# Patient Record
Sex: Female | Born: 1970 | Race: White | Hispanic: No | Smoking: Never smoker
Health system: Southern US, Community
[De-identification: ages and names within clinical notes are randomized; demographics above are authoritative.]

## PROBLEM LIST (undated history)

## (undated) DIAGNOSIS — F32A Depression, unspecified: Secondary | ICD-10-CM

## (undated) DIAGNOSIS — J45909 Unspecified asthma, uncomplicated: Secondary | ICD-10-CM

## (undated) DIAGNOSIS — Q613 Polycystic kidney, unspecified: Secondary | ICD-10-CM

## (undated) DIAGNOSIS — I10 Essential (primary) hypertension: Secondary | ICD-10-CM

## (undated) DIAGNOSIS — H5500 Unspecified nystagmus: Secondary | ICD-10-CM

## (undated) DIAGNOSIS — N83209 Unspecified ovarian cyst, unspecified side: Secondary | ICD-10-CM

## (undated) HISTORY — PX: CHOLECYSTECTOMY: SHX55

---

## 2019-10-29 ENCOUNTER — Emergency Department (HOSPITAL_COMMUNITY)
Admission: EM | Admit: 2019-10-29 | Discharge: 2019-10-29 | Disposition: A | Discharge status: Home / Self Care | Payer: Self-pay | Attending: Emergency Medicine | Admitting: Emergency Medicine

## 2019-10-29 ENCOUNTER — Other Ambulatory Visit: Payer: Self-pay

## 2019-10-29 ENCOUNTER — Encounter (HOSPITAL_COMMUNITY): Payer: Self-pay

## 2019-10-29 DIAGNOSIS — R110 Nausea: Secondary | ICD-10-CM

## 2019-10-29 DIAGNOSIS — R112 Nausea with vomiting, unspecified: Secondary | ICD-10-CM | POA: Insufficient documentation

## 2019-10-29 DIAGNOSIS — J45909 Unspecified asthma, uncomplicated: Secondary | ICD-10-CM | POA: Insufficient documentation

## 2019-10-29 DIAGNOSIS — J029 Acute pharyngitis, unspecified: Secondary | ICD-10-CM | POA: Insufficient documentation

## 2019-10-29 DIAGNOSIS — I10 Essential (primary) hypertension: Secondary | ICD-10-CM | POA: Insufficient documentation

## 2019-10-29 DIAGNOSIS — Z20828 Contact with and (suspected) exposure to other viral communicable diseases: Secondary | ICD-10-CM | POA: Insufficient documentation

## 2019-10-29 HISTORY — DX: Unspecified asthma, uncomplicated: J45.909

## 2019-10-29 HISTORY — DX: Essential (primary) hypertension: I10

## 2019-10-29 LAB — GROUP A STREP BY PCR: Group A Strep by PCR: NOT DETECTED

## 2019-10-29 MED ORDER — ONDANSETRON HCL 4 MG PO TABS: 4.0000 mg | ORAL_TABLET | Freq: Four times a day (QID) | ORAL | 0 refills | Status: DC

## 2019-10-29 MED ORDER — ONDANSETRON 4 MG PO TBDP
4.0000 mg | ORAL_TABLET | Freq: Once | ORAL | Status: AC
  Administered 2019-10-29: 20:00:00 4 mg via ORAL
  Filled 2019-10-29: qty 1

## 2019-10-29 NOTE — ED Triage Notes (Signed)
Pt arrives to ED with c/o sore throat and n/v x 1 day.

## 2019-10-29 NOTE — ED Provider Notes (Signed)
[facility] EMERGENCY DEPARTMENT Provider Note   CSN: [id number] Arrival date & time: 10/29/19  1804     History   Chief Complaint Chief Complaint  Patient presents with  . Sore Throat    HPI Tracey Cobb is a 48 y.o. female.     The history is provided by the patient. No language interpreter was used.  Sore Throat     48 year old female with history of asthma, hypertension, presenting complaining of sore throat.  Patient report throughout the day today she felt nauseous, vomited 3 times of nonbloody nonbilious content.  States from vomiting she endorsed some throat irritation.  She endorsed some mild epigastric tenderness before vomiting which improves after vomiting.  She does not complain of any fever chills, no runny nose sneezing or coughing no chest pain shortness of breath and no dysuria.  She is here per recommendation of her workplace.  She denies any recent sick contact with anyone with COVID-19.  She did endorse her mom has a slight cold several days prior.  She did recall eating a chicken salad sandwich yesterday from her workplace from a vending machine.  She is unsure if that may have caused her symptoms.  Past Medical History:  Diagnosis Date  . Asthma   . Hypertension     There are no active problems to display for this patient.   History reviewed. No pertinent surgical history.   OB History   No obstetric history on file.      Home Medications    Prior to Admission medications   Not on File    Family History History reviewed. No pertinent family history.  Social History Social History   Tobacco Use  . Smoking status: Never Smoker  . Smokeless tobacco: Never Used  Substance Use Topics  . Alcohol use: Not on file  . Drug use: Not on file     Allergies   Patient has no allergy information on record.   Review of Systems Review of Systems  All other systems reviewed and are negative.    Physical Exam Updated  Vital Signs BP (!) 157/103 (BP Location: Left Arm)   Pulse 72   Temp 97.7 F (36.5 C) (Oral)   Resp 16   SpO2 100%   Physical Exam Vitals signs and nursing note reviewed.  Constitutional:      General: She is not in acute distress.    Appearance: She is well-developed. She is obese.  HENT:     Head: Atraumatic.     Mouth/Throat:     Mouth: Mucous membranes are moist.     Pharynx: Uvula midline. No pharyngeal swelling, oropharyngeal exudate, posterior oropharyngeal erythema or uvula swelling.     Tonsils: No tonsillar exudate or tonsillar abscesses.  Eyes:     Conjunctiva/sclera: Conjunctivae normal.  Neck:     Musculoskeletal: Neck supple.  Cardiovascular:     Rate and Rhythm: Normal rate and regular rhythm.  Pulmonary:     Effort: Pulmonary effort is normal.     Breath sounds: Normal breath sounds. No wheezing, rhonchi or rales.  Abdominal:     Palpations: Abdomen is soft.     Tenderness: There is no abdominal tenderness.     Comments: Negative Murphy sign, no pain at McBurney's point.  Skin:    Findings: No rash.  Neurological:     Mental Status: She is alert and oriented to person, place, and time.  Psychiatric:  Mood and Affect: Mood normal.      ED Treatments / Results  Labs (all labs ordered are listed, but only abnormal results are displayed) Labs Reviewed  GROUP A STREP BY PCR  SARS CORONAVIRUS 2 (TAT 6-24 HRS)    EKG None  Radiology No results found.  Procedures Procedures (including critical care time)  Medications Ordered in ED Medications - No data to display   Initial Impression / Assessment and Plan / ED Course  I have reviewed the triage vital signs and the nursing notes.  Pertinent labs & imaging results that were available during my care of the patient were reviewed by me and considered in my medical decision making (see chart for details).        BP (!) 157/103 (BP Location: Left Arm)   Pulse 72   Temp 97.7 F (36.5 C)  (Oral)   Resp 16   SpO2 100%    Final Clinical Impressions(s) / ED Diagnoses   Final diagnoses:  Sore throat  Nausea    ED Discharge Orders         Ordered    ondansetron (ZOFRAN) 4 MG tablet  Every 6 hours     10/29/19 2109         7:33 PM Patient endorse sore throat from 3 episodes of vomiting throughout the day today.  Throat exam unremarkable.  Strep test ordered.  Covid test ordered as well.  Abdomen is soft nontender, low suspicion for gallbladder etiology at this time.  Zofran given for nausea.  9:04 PM Strep test negative.  COVID-19 is currently pending.  Pt d/c home with zofran for nausea.  Encourage return if worsening pain.  Patient also made me aware that she has had prior cholecystectomy.  Tracey Cobb was evaluated in Emergency Department on 10/29/2019 for the symptoms described in the history of present illness. She was evaluated in the context of the global COVID-19 pandemic, which necessitated consideration that the patient might be at risk for infection with the SARS-CoV-2 virus that causes COVID-19. Institutional protocols and algorithms that pertain to the evaluation of patients at risk for COVID-19 are in a state of rapid change based on information released by regulatory bodies including the CDC and federal and state organizations. These policies and algorithms were followed during the patient's care in the ED.    Fayrene Helper, PA-C 10/29/19 2111    Charlynne Pander, MD 10/29/19 [phone]

## 2019-10-29 NOTE — Discharge Instructions (Signed)
Take zofran as needed for nausea.  A covid-19 test have been sent, if positive you will be notify in 2-3 days.  Return if you have any concerns. Eat bland food until your symptoms resolve

## 2019-10-29 NOTE — ED Notes (Signed)
Patient verbalizes understanding of discharge instructions. Opportunity for questioning and answers were provided. Armband removed by staff, pt discharged from ED ambulatory.   

## 2019-10-30 LAB — SARS CORONAVIRUS 2 (TAT 6-24 HRS): SARS Coronavirus 2: NEGATIVE

## 2020-08-30 ENCOUNTER — Emergency Department (HOSPITAL_COMMUNITY)
Admission: EM | Admit: 2020-08-30 | Discharge: 2020-08-30 | Disposition: A | Discharge status: Home / Self Care | Payer: Self-pay | Attending: Emergency Medicine | Admitting: Emergency Medicine

## 2020-08-30 ENCOUNTER — Encounter (HOSPITAL_COMMUNITY): Payer: Self-pay | Admitting: Emergency Medicine

## 2020-08-30 ENCOUNTER — Other Ambulatory Visit: Payer: Self-pay

## 2020-08-30 DIAGNOSIS — R102 Pelvic and perineal pain: Secondary | ICD-10-CM | POA: Insufficient documentation

## 2020-08-30 DIAGNOSIS — R110 Nausea: Secondary | ICD-10-CM | POA: Insufficient documentation

## 2020-08-30 DIAGNOSIS — Z5321 Procedure and treatment not carried out due to patient leaving prior to being seen by health care provider: Secondary | ICD-10-CM | POA: Insufficient documentation

## 2020-08-30 HISTORY — DX: Polycystic kidney, unspecified: Q61.3

## 2020-08-30 HISTORY — DX: Unspecified ovarian cyst, unspecified side: N83.209

## 2020-08-30 LAB — URINALYSIS, ROUTINE W REFLEX MICROSCOPIC

## 2020-08-30 LAB — URINALYSIS, MICROSCOPIC (REFLEX): RBC / HPF: >50 RBC/hpf (ref 0–5)

## 2020-08-30 NOTE — ED Notes (Signed)
Pt did not answer for vs x3

## 2020-08-30 NOTE — ED Triage Notes (Signed)
C/o L sided pelvic pain since yesterday with nausea.  Hx of ovarian cyst.  States it feels like when her cyst ruptured.

## 2020-08-31 ENCOUNTER — Emergency Department (HOSPITAL_BASED_OUTPATIENT_CLINIC_OR_DEPARTMENT_OTHER): Payer: Self-pay

## 2020-08-31 ENCOUNTER — Encounter (HOSPITAL_BASED_OUTPATIENT_CLINIC_OR_DEPARTMENT_OTHER): Payer: Self-pay

## 2020-08-31 ENCOUNTER — Other Ambulatory Visit: Payer: Self-pay

## 2020-08-31 ENCOUNTER — Emergency Department (HOSPITAL_BASED_OUTPATIENT_CLINIC_OR_DEPARTMENT_OTHER)
Admission: EM | Admit: 2020-08-31 | Discharge: 2020-08-31 | Disposition: A | Discharge status: Home / Self Care | Payer: Self-pay | Attending: Emergency Medicine | Admitting: Emergency Medicine

## 2020-08-31 DIAGNOSIS — I10 Essential (primary) hypertension: Secondary | ICD-10-CM | POA: Insufficient documentation

## 2020-08-31 DIAGNOSIS — Z79899 Other long term (current) drug therapy: Secondary | ICD-10-CM | POA: Insufficient documentation

## 2020-08-31 DIAGNOSIS — J45909 Unspecified asthma, uncomplicated: Secondary | ICD-10-CM | POA: Insufficient documentation

## 2020-08-31 DIAGNOSIS — R109 Unspecified abdominal pain: Secondary | ICD-10-CM | POA: Insufficient documentation

## 2020-08-31 LAB — URINALYSIS, ROUTINE W REFLEX MICROSCOPIC
Bilirubin Urine: NEGATIVE
Glucose, UA: NEGATIVE mg/dL
Ketones, ur: NEGATIVE mg/dL
Nitrite: NEGATIVE
Protein, ur: NEGATIVE mg/dL
Specific Gravity, Urine: 1.015 (ref 1.005–1.030)
pH: 7.5 (ref 5.0–8.0)

## 2020-08-31 LAB — COMPREHENSIVE METABOLIC PANEL
ALT: 22 U/L (ref 0–44)
AST: 16 U/L (ref 15–41)
Albumin: 3.8 g/dL (ref 3.5–5.0)
Alkaline Phosphatase: 85 U/L (ref 38–126)
Anion gap: 8 (ref 5–15)
BUN: 7 mg/dL (ref 6–20)
CO2: 25 mmol/L (ref 22–32)
Calcium: 8.9 mg/dL (ref 8.9–10.3)
Chloride: 102 mmol/L (ref 98–111)
Creatinine, Ser: 0.67 mg/dL (ref 0.44–1.00)
GFR calc Af Amer: >60 mL/min (ref >60)
GFR calc non Af Amer: >60 mL/min (ref >60)
Glucose, Bld: 104 mg/dL — ABNORMAL HIGH (ref 70–99)
Potassium: 3.8 mmol/L (ref 3.5–5.1)
Sodium: 135 mmol/L (ref 135–145)
Total Bilirubin: 0.2 mg/dL — ABNORMAL LOW (ref 0.3–1.2)
Total Protein: 7.3 g/dL (ref 6.5–8.1)

## 2020-08-31 LAB — CBC WITH DIFFERENTIAL/PLATELET
Abs Immature Granulocytes: 0.01 10*3/uL (ref 0.00–0.07)
Basophils Absolute: 0.1 10*3/uL (ref 0.0–0.1)
Basophils Relative: 1 %
Eosinophils Absolute: 0.2 10*3/uL (ref 0.0–0.5)
Eosinophils Relative: 3 %
HCT: 34.6 % — ABNORMAL LOW (ref 36.0–46.0)
Hemoglobin: 11.2 g/dL — ABNORMAL LOW (ref 12.0–15.0)
Immature Granulocytes: 0 %
Lymphocytes Relative: 33 %
Lymphs Abs: 2 10*3/uL (ref 0.7–4.0)
MCH: 28.4 pg (ref 26.0–34.0)
MCHC: 32.4 g/dL (ref 30.0–36.0)
MCV: 87.6 fL (ref 80.0–100.0)
Monocytes Absolute: 0.6 10*3/uL (ref 0.1–1.0)
Monocytes Relative: 10 %
Neutro Abs: 3.3 10*3/uL (ref 1.7–7.7)
Neutrophils Relative %: 53 %
Platelets: 317 10*3/uL (ref 150–400)
RBC: 3.95 MIL/uL (ref 3.87–5.11)
RDW: 13.6 % (ref 11.5–15.5)
WBC: 6.1 10*3/uL (ref 4.0–10.5)
nRBC: 0 % (ref 0.0–0.2)

## 2020-08-31 LAB — URINALYSIS, MICROSCOPIC (REFLEX)

## 2020-08-31 LAB — PREGNANCY, URINE: Preg Test, Ur: NEGATIVE

## 2020-08-31 MED ORDER — ONDANSETRON HCL 4 MG/2ML IJ SOLN
4.0000 mg | Freq: Once | INTRAMUSCULAR | Status: AC
  Administered 2020-08-31: 4 mg via INTRAVENOUS
  Filled 2020-08-31: qty 2

## 2020-08-31 MED ORDER — MORPHINE SULFATE (PF) 4 MG/ML IV SOLN
4.0000 mg | Freq: Once | INTRAVENOUS | Status: AC
  Administered 2020-08-31: 4 mg via INTRAVENOUS
  Filled 2020-08-31: qty 1

## 2020-08-31 MED ORDER — ONDANSETRON 4 MG PO TBDP: 4.0000 mg | ORAL_TABLET | ORAL | 0 refills | Status: DC | PRN

## 2020-08-31 MED ORDER — HYDROCODONE-ACETAMINOPHEN 5-325 MG PO TABS: 1.0000 | ORAL_TABLET | Freq: Four times a day (QID) | ORAL | 0 refills | Status: DC | PRN

## 2020-08-31 MED ORDER — IBUPROFEN 600 MG PO TABS: 600.0000 mg | ORAL_TABLET | Freq: Three times a day (TID) | ORAL | 0 refills | Status: DC | PRN

## 2020-08-31 MED ORDER — CEPHALEXIN 500 MG PO CAPS: 1000.0000 mg | ORAL_CAPSULE | Freq: Two times a day (BID) | ORAL | 0 refills | Status: DC

## 2020-08-31 MED ORDER — KETOROLAC TROMETHAMINE 30 MG/ML IJ SOLN
30.0000 mg | Freq: Once | INTRAMUSCULAR | Status: AC
  Administered 2020-08-31: 30 mg via INTRAVENOUS
  Filled 2020-08-31: qty 1

## 2020-08-31 NOTE — ED Notes (Signed)
ED Provider at bedside. 

## 2020-08-31 NOTE — ED Provider Notes (Signed)
[facility] EMERGENCY DEPARTMENT Provider Note   CSN: [id number] Arrival date & time: 08/31/20  1141     History Chief Complaint  Patient presents with  . Flank Pain    Tracey Cobb is a 49 y.o. female.  Patient is a 49 year old female with a history of ovarian cyst, obesity, hypertension, polycystic kidney disease and prior kidney stones status post lithotripsy in 2016 who presents today with sudden onset of left-sided abdominal pain that started at 6 PM last night.  She reports the pain is severe and causes nausea but she has not had any vomiting.  She reports this pain feels similar to when she has had prior kidney stones.  She denies any fever, dysuria and reports the pain wraps from the left flank around to the abdomen.  Laying down seems to make it worse nothing seems to make it better.  The history is provided by the patient.  Flank Pain This is a new problem. The current episode started yesterday. The problem occurs constantly. The problem has not changed since onset.Associated symptoms include abdominal pain.       Past Medical History:  Diagnosis Date  . Asthma   . Hypertension   . Ovarian cyst   . Polycystic kidney disease     There are no problems to display for this patient.   History reviewed. No pertinent surgical history.   OB History   No obstetric history on file.     No family history on file.  Social History   Tobacco Use  . Smoking status: Never Smoker  . Smokeless tobacco: Never Used  Vaping Use  . Vaping Use: Never used  Substance Use Topics  . Alcohol use: Not Currently  . Drug use: Not Currently    Home Medications Prior to Admission medications   Medication Sig Start Date End Date Taking? Authorizing Provider  ondansetron (ZOFRAN) 4 MG tablet Take 1 tablet (4 mg total) by mouth every 6 (six) hours. 10/29/19   Fayrene Helper, PA-C    Allergies    Benadryl [diphenhydramine], Ivp dye [iodinated diagnostic agents], and  Reglan [metoclopramide]  Review of Systems   Review of Systems  Gastrointestinal: Positive for abdominal pain.  Genitourinary: Positive for flank pain.  All other systems reviewed and are negative.   Physical Exam Updated Vital Signs BP 139/81 (BP Location: Left Arm)   Pulse 72   Temp 97.8 F (36.6 C) (Oral)   Resp 16   Ht 5\' 2"  (1.575 m)   Wt 132 kg   LMP 08/27/2020   SpO2 98%   BMI 53.22 kg/m   Physical Exam Vitals and nursing note reviewed.  Constitutional:      General: She is in acute distress.     Appearance: Normal appearance. She is well-developed. She is obese.  HENT:     Head: Normocephalic and atraumatic.  Eyes:     Pupils: Pupils are equal, round, and reactive to light.  Cardiovascular:     Rate and Rhythm: Normal rate and regular rhythm.     Heart sounds: Normal heart sounds. No murmur heard.  No friction rub.  Pulmonary:     Effort: Pulmonary effort is normal.     Breath sounds: Normal breath sounds. No wheezing or rales.  Abdominal:     General: Bowel sounds are normal. There is no distension.     Palpations: Abdomen is soft.     Tenderness: There is no abdominal tenderness. There is left CVA tenderness. There  is no guarding or rebound.  Musculoskeletal:        General: No tenderness. Normal range of motion.     Cervical back: Normal range of motion and neck supple.     Comments: No edema  Skin:    General: Skin is warm and dry.     Findings: No rash.  Neurological:     General: No focal deficit present.     Mental Status: She is alert and oriented to person, place, and time. Mental status is at baseline.     Cranial Nerves: No cranial nerve deficit.  Psychiatric:        Mood and Affect: Mood normal.        Behavior: Behavior normal.        Thought Content: Thought content normal.     ED Results / Procedures / Treatments   Labs (all labs ordered are listed, but only abnormal results are displayed) Labs Reviewed  URINALYSIS, ROUTINE W  REFLEX MICROSCOPIC - Abnormal; Notable for the following components:      Result Value   Hgb urine dipstick LARGE (*)    Leukocytes,Ua TRACE (*)    All other components within normal limits  URINALYSIS, MICROSCOPIC (REFLEX) - Abnormal; Notable for the following components:   Bacteria, UA FEW (*)    All other components within normal limits  PREGNANCY, URINE  CBC WITH DIFFERENTIAL/PLATELET  COMPREHENSIVE METABOLIC PANEL    EKG None  Radiology No results found.  Procedures Procedures (including critical care time)  Medications Ordered in ED Medications  morphine 4 MG/ML injection 4 mg (has no administration in time range)  ondansetron (ZOFRAN) injection 4 mg (has no administration in time range)    ED Course  I have reviewed the triage vital signs and the nursing notes.  Pertinent labs & imaging results that were available during my care of the patient were reviewed by me and considered in my medical decision making (see chart for details).    MDM Rules/Calculators/A&P                          Pt with symptoms consistent with kidney stone.  Denies infectious sx, or GI symptoms.  Low concern for diverticulitis and no risk factors or history suggestive of AAA.  No hx suggestive of GU source (discharge) and otherwise pt is healthy.  Will hydrate, treat pain and ensure no infection with UA, CBC, CMP and will get stone study to further eval.  2:36 PM UA with large hemoglobin.  Labs and imaging pending.  Final Clinical Impression(s) / ED Diagnoses Final diagnoses:  None    Rx / DC Orders ED Discharge Orders    None       Gwyneth Sprout, MD 09/01/20 0740

## 2020-08-31 NOTE — ED Notes (Signed)
Patient transported to CT 

## 2020-08-31 NOTE — ED Triage Notes (Signed)
Pt c/o "kidney pain" left flank x 2 days-NAD-slow gait

## 2020-08-31 NOTE — ED Provider Notes (Signed)
Pain control. Outpatient F/U Physical Exam  BP 139/81 (BP Location: Left Arm)    Pulse 72    Temp 97.8 F (36.6 C) (Oral)    Resp 16    Ht 5\' 2"  (1.575 m)    Wt 132 kg    LMP 08/26/2020    SpO2 98%    BMI 53.22 kg/m   Physical Exam Patient is alert and nontoxic. She does have right-sided flank pain in mid right abdominal pain to outpatient. No guarding. ED Course/Procedures     Procedures  MDM  Review of urinalysis from yesterday does show greater than 50 red cells and white cells present with a clean appearing specimen. Some patient symptoms will add Keflex and patient will follow up on culture results which have been submitted. Patient does not currently have a PCP in the area. Resource guide provided and patient counseled on using referral number. Return precautions reviewed.       08/28/2020, MD 08/31/20 1600

## 2020-08-31 NOTE — Discharge Instructions (Signed)
1. A urine culture has been done. You should be able to get results in 24 to 48 h. In the meantime, you have been placed on Keflex for symptoms of urinary tract infection. 2. Get scheduled to follow-up with a family doctor soon as possible. You may try  community health and wellness as listed in your discharge instructions, as well a guide of low cost medical facilities is included. 3. Return to the emergency department if you get fevers, worsening pain, vomiting or other concerning symptoms.

## 2020-08-31 NOTE — ED Notes (Signed)
Attempted IV x 2 , tol well, unsuccessful

## 2020-09-01 LAB — URINE CULTURE: Culture: 50000 — AB

## 2020-09-03 ENCOUNTER — Telehealth (HOSPITAL_BASED_OUTPATIENT_CLINIC_OR_DEPARTMENT_OTHER): Payer: Self-pay | Admitting: Emergency Medicine

## 2020-09-03 NOTE — Telephone Encounter (Signed)
Post ED Visit - Positive Culture Follow-up  Culture report reviewed by antimicrobial stewardship pharmacist: [facility] []  [facility], Pharm.D. []  [facility], Pharm.D., BCPS AQ-ID []  , Pharm.D., BCPS []  Celedonio Miyamoto, Pharm.D., BCPS []  [facility], Garvin Fila.D., BCPS, AAHIVP []  , Pharm.D., BCPS, AAHIVP []  Georgina Pillion, PharmD, BCPS []  , PharmD, BCPS []  [place], PharmD, BCPS [x]  [address], PharmD []  , PharmD, BCPS []  Estella Husk, PharmD  Pharmacy Team []  Lysle Pearl, PharmD []  , PharmD []  Phillips Climes, PharmD []  , Rph []  Agap[employer] Games) , PharmD []  Joaquim Lai, PharmD []  , PharmD []  Mervyn Gay, PharmD []  , PharmD []  Vinnie Level, PharmD []  [facility], PharmD []  , PharmD []  Len Childs, PharmD   Positive urine culture Treated with Cephalexin, organism sensitive to the same and no further patient follow-up is required at this time.  09/03/2020, 9:37 AM

## 2020-10-26 ENCOUNTER — Other Ambulatory Visit: Payer: Self-pay

## 2020-10-26 ENCOUNTER — Emergency Department (HOSPITAL_COMMUNITY)
Admission: EM | Admit: 2020-10-26 | Discharge: 2020-10-26 | Disposition: A | Discharge status: Home / Self Care | Payer: Self-pay | Attending: Emergency Medicine | Admitting: Emergency Medicine

## 2020-10-26 ENCOUNTER — Emergency Department (HOSPITAL_BASED_OUTPATIENT_CLINIC_OR_DEPARTMENT_OTHER)
Admission: EM | Admit: 2020-10-26 | Discharge: 2020-10-26 | Disposition: A | Discharge status: Home / Self Care | Payer: Self-pay | Attending: Emergency Medicine | Admitting: Emergency Medicine

## 2020-10-26 ENCOUNTER — Emergency Department (HOSPITAL_COMMUNITY): Payer: Self-pay

## 2020-10-26 ENCOUNTER — Encounter (HOSPITAL_COMMUNITY): Payer: Self-pay | Admitting: Emergency Medicine

## 2020-10-26 ENCOUNTER — Encounter (HOSPITAL_BASED_OUTPATIENT_CLINIC_OR_DEPARTMENT_OTHER): Payer: Self-pay | Admitting: *Deleted

## 2020-10-26 DIAGNOSIS — J40 Bronchitis, not specified as acute or chronic: Secondary | ICD-10-CM | POA: Insufficient documentation

## 2020-10-26 DIAGNOSIS — I10 Essential (primary) hypertension: Secondary | ICD-10-CM | POA: Insufficient documentation

## 2020-10-26 DIAGNOSIS — R079 Chest pain, unspecified: Secondary | ICD-10-CM | POA: Insufficient documentation

## 2020-10-26 DIAGNOSIS — R040 Epistaxis: Secondary | ICD-10-CM | POA: Insufficient documentation

## 2020-10-26 DIAGNOSIS — R42 Dizziness and giddiness: Secondary | ICD-10-CM | POA: Insufficient documentation

## 2020-10-26 DIAGNOSIS — R059 Cough, unspecified: Secondary | ICD-10-CM | POA: Insufficient documentation

## 2020-10-26 DIAGNOSIS — Z20822 Contact with and (suspected) exposure to covid-19: Secondary | ICD-10-CM | POA: Insufficient documentation

## 2020-10-26 DIAGNOSIS — Z5321 Procedure and treatment not carried out due to patient leaving prior to being seen by health care provider: Secondary | ICD-10-CM | POA: Insufficient documentation

## 2020-10-26 LAB — BASIC METABOLIC PANEL
Anion gap: 8 (ref 5–15)
BUN: 11 mg/dL (ref 6–20)
CO2: 25 mmol/L (ref 22–32)
Calcium: 8.8 mg/dL — ABNORMAL LOW (ref 8.9–10.3)
Chloride: 106 mmol/L (ref 98–111)
Creatinine, Ser: 0.88 mg/dL (ref 0.44–1.00)
GFR, Estimated: >60 mL/min (ref >60)
Glucose, Bld: 151 mg/dL — ABNORMAL HIGH (ref 70–99)
Potassium: 3.8 mmol/L (ref 3.5–5.1)
Sodium: 139 mmol/L (ref 135–145)

## 2020-10-26 LAB — CBC
HCT: 38.4 % (ref 36.0–46.0)
Hemoglobin: 12.1 g/dL (ref 12.0–15.0)
MCH: 27.1 pg (ref 26.0–34.0)
MCHC: 31.5 g/dL (ref 30.0–36.0)
MCV: 86.1 fL (ref 80.0–100.0)
Platelets: 308 10*3/uL (ref 150–400)
RBC: 4.46 MIL/uL (ref 3.87–5.11)
RDW: 15.3 % (ref 11.5–15.5)
WBC: 7.3 10*3/uL (ref 4.0–10.5)
nRBC: 0 % (ref 0.0–0.2)

## 2020-10-26 LAB — RESP PANEL BY RT-PCR (FLU A&B, COVID) ARPGX2
Influenza A by PCR: NEGATIVE
Influenza B by PCR: NEGATIVE
SARS Coronavirus 2 by RT PCR: NEGATIVE

## 2020-10-26 LAB — I-STAT BETA HCG BLOOD, ED (MC, WL, AP ONLY): I-stat hCG, quantitative: <5 m[IU]/mL (ref <5)

## 2020-10-26 LAB — TROPONIN I (HIGH SENSITIVITY): Troponin I (High Sensitivity): <2 ng/L (ref <18)

## 2020-10-26 MED ORDER — PREDNISONE 20 MG PO TABS: ORAL_TABLET | ORAL | 0 refills | Status: DC

## 2020-10-26 MED ORDER — ALBUTEROL SULFATE HFA 108 (90 BASE) MCG/ACT IN AERS
2.0000 | INHALATION_SPRAY | Freq: Once | RESPIRATORY_TRACT | Status: AC
  Administered 2020-10-26: 2 via RESPIRATORY_TRACT
  Filled 2020-10-26: qty 6.7

## 2020-10-26 MED ORDER — PREDNISONE 50 MG PO TABS
60.0000 mg | ORAL_TABLET | Freq: Once | ORAL | Status: AC
  Administered 2020-10-26: 60 mg via ORAL
  Filled 2020-10-26: qty 1

## 2020-10-26 NOTE — ED Notes (Signed)
Pt refused covid test

## 2020-10-26 NOTE — ED Notes (Signed)
Pt states they are leaving  

## 2020-10-26 NOTE — ED Triage Notes (Signed)
C/o cough and head congestion x 2 days

## 2020-10-26 NOTE — Discharge Instructions (Signed)
You likely have some bronchitis.  Take prednisone as prescribed.  Use albuterol every 4 hours for wheezing.  You were tested for Covid and please stay home until results come back in several hours.  If you are positive you will need to stay home for 10 days for  See your doctor for follow-up  Return to ER if you have trouble breathing, worse wheezing, cough, fevers.

## 2020-10-26 NOTE — ED Provider Notes (Signed)
[facility] EMERGENCY DEPARTMENT Provider Note   CSN: [id number] Arrival date & time: 10/26/20  2145     History Chief Complaint  Patient presents with  . covid symptoms    Tracey Cobb is a 49 y.o. female history of asthma, hypertension here presenting with cough and congestion.  Patient states that for the last 2 days she started having sore throat and then had congestion and cough.  Patient has a history of asthma and was concerned she has asthma attack.  She states that she received 2 doses of her Covid vaccine already.  She states that her mother is sick with mild sore throat that resolved.  No known sick contacts with Covid.  She went to [facility] earlier today and had a normal chest x-ray and normal labs.  She left without being seen however.  The history is provided by the patient.       Past Medical History:  Diagnosis Date  . Asthma   . Hypertension   . Ovarian cyst   . Polycystic kidney disease     There are no problems to display for this patient.   History reviewed. No pertinent surgical history.   OB History   No obstetric history on file.     No family history on file.  Social History   Tobacco Use  . Smoking status: Never Smoker  . Smokeless tobacco: Never Used  Vaping Use  . Vaping Use: Never used  Substance Use Topics  . Alcohol use: Not Currently  . Drug use: Not Currently    Home Medications Prior to Admission medications   Medication Sig Start Date End Date Taking? Authorizing Provider  cephALEXin (KEFLEX) 500 MG capsule Take 2 capsules (1,000 mg total) by mouth 2 (two) times daily. 08/31/20   Arby Barrette, MD  HYDROcodone-acetaminophen (NORCO/VICODIN) 5-325 MG tablet Take 1 tablet by mouth every 6 (six) hours as needed for moderate pain or severe pain. 08/31/20   Arby Barrette, MD  ibuprofen (ADVIL) 600 MG tablet Take 1 tablet (600 mg total) by mouth every 8 (eight) hours as needed. 08/31/20   Arby Barrette, MD  ondansetron  (ZOFRAN ODT) 4 MG disintegrating tablet Take 1 tablet (4 mg total) by mouth every 4 (four) hours as needed for nausea or vomiting. 08/31/20   Arby Barrette, MD  ondansetron (ZOFRAN) 4 MG tablet Take 1 tablet (4 mg total) by mouth every 6 (six) hours. 10/29/19   Fayrene Helper, PA-C  predniSONE (DELTASONE) 20 MG tablet Take 60 mg daily x 2 days then 40 mg daily x 2 days then 20 mg daily x 2 days 10/26/20   Charlynne Pander, MD    Allergies    Benadryl [diphenhydramine], Ivp dye [iodinated diagnostic agents], and Reglan [metoclopramide]  Review of Systems   Review of Systems  Respiratory: Positive for choking.   All other systems reviewed and are negative.   Physical Exam Updated Vital Signs BP (!) 148/100 (BP Location: Left Arm)   Pulse 77   Temp 97.8 F (36.6 C) (Oral)   Resp 20   Ht 5\' 2"  (1.575 m)   Wt 129.7 kg   SpO2 96%   BMI 52.31 kg/m   Physical Exam Vitals and nursing note reviewed.  Constitutional:      Appearance: Normal appearance.  HENT:     Head: Normocephalic.     Ears:     Comments: There is fluid behind both ears but no obvious otitis media    Nose:  Nose normal.     Mouth/Throat:     Mouth: Mucous membranes are moist.  Eyes:     Extraocular Movements: Extraocular movements intact.     Pupils: Pupils are equal, round, and reactive to light.  Cardiovascular:     Rate and Rhythm: Normal rate and regular rhythm.     Pulses: Normal pulses.     Heart sounds: Normal heart sounds.  Pulmonary:     Comments: Diminished breath sounds bilaterally but no wheezing or retraction Abdominal:     General: Abdomen is flat.     Palpations: Abdomen is soft.  Musculoskeletal:        General: Normal range of motion.     Cervical back: Normal range of motion and neck supple.  Skin:    General: Skin is warm.     Capillary Refill: Capillary refill takes less than 2 seconds.  Neurological:     General: No focal deficit present.     Mental Status: She is alert and  oriented to person, place, and time.  Psychiatric:        Mood and Affect: Mood normal.        Behavior: Behavior normal.     ED Results / Procedures / Treatments   Labs (all labs ordered are listed, but only abnormal results are displayed) Labs Reviewed  RESP PANEL BY RT-PCR (FLU A&B, COVID) [id number]    EKG None  Radiology DG Chest 2 View  Result Date: 10/26/2020 CLINICAL DATA:  Chest pain and cough EXAM: CHEST - 2 VIEW COMPARISON:  March 06, 2020 FINDINGS: Lungs are clear. Heart size and pulmonary vascularity are normal. No adenopathy. No pneumothorax. No bone lesions. IMPRESSION: Lungs clear.  Cardiac silhouette normal Electronically Signed   By: Bretta Bang III M.D.   On: 10/26/2020 18:48    Procedures Procedures (including critical care time)  Medications Ordered in ED Medications  predniSONE (DELTASONE) tablet 60 mg (has no administration in time range)  albuterol (VENTOLIN HFA) 108 (90 Base) MCG/ACT inhaler 2 puff (has no administration in time range)    ED Course  I have reviewed the triage vital signs and the nursing notes.  Pertinent labs & imaging results that were available during my care of the patient were reviewed by me and considered in my medical decision making (see chart for details).    MDM Rules/Calculators/A&P                         Tracey Cobb is a 49 y.o. female presenting with cough and congestion.  Likely bronchitis versus Covid.  She had chest x-ray and labs done earlier today that was unremarkable.  Patient is now agreeable to Covid test.  She is not hypoxic so will not need admission for Covid anyway.  Will discharge home with course of prednisone and albuterol as needed.  Tracey Cobb was evaluated in Emergency Department on 10/26/2020 for the symptoms described in the history of present illness. She was evaluated in the context of the global COVID-19 pandemic, which necessitated consideration that the patient might be at risk for  infection with the SARS-CoV-2 virus that causes COVID-19. Institutional protocols and algorithms that pertain to the evaluation of patients at risk for COVID-19 are in a state of rapid change based on information released by regulatory bodies including the CDC and federal and state organizations. These policies and algorithms were followed during the patient's care in the ED.    Final Clinical  Impression(s) / ED Diagnoses Final diagnoses:  Bronchitis    Rx / DC Orders ED Discharge Orders         Ordered    predniSONE (DELTASONE) 20 MG tablet        10/26/20 2205           Charlynne Pander, MD 10/26/20 2210

## 2020-10-26 NOTE — ED Triage Notes (Signed)
Pt endorses cough for 2 days and that she coughed so hard that her nose bled. States her chest hurts from coughing so much, lightheadedness and dizzy.

## 2020-11-05 ENCOUNTER — Encounter (HOSPITAL_BASED_OUTPATIENT_CLINIC_OR_DEPARTMENT_OTHER): Payer: Self-pay | Admitting: Emergency Medicine

## 2020-11-05 ENCOUNTER — Other Ambulatory Visit: Payer: Self-pay

## 2020-11-05 ENCOUNTER — Emergency Department (HOSPITAL_BASED_OUTPATIENT_CLINIC_OR_DEPARTMENT_OTHER): Payer: Self-pay

## 2020-11-05 ENCOUNTER — Emergency Department (HOSPITAL_BASED_OUTPATIENT_CLINIC_OR_DEPARTMENT_OTHER)
Admission: EM | Admit: 2020-11-05 | Discharge: 2020-11-05 | Disposition: A | Discharge status: Home / Self Care | Payer: Self-pay | Attending: Emergency Medicine | Admitting: Emergency Medicine

## 2020-11-05 DIAGNOSIS — J069 Acute upper respiratory infection, unspecified: Secondary | ICD-10-CM | POA: Insufficient documentation

## 2020-11-05 DIAGNOSIS — I10 Essential (primary) hypertension: Secondary | ICD-10-CM | POA: Insufficient documentation

## 2020-11-05 DIAGNOSIS — H9203 Otalgia, bilateral: Secondary | ICD-10-CM | POA: Insufficient documentation

## 2020-11-05 DIAGNOSIS — J45909 Unspecified asthma, uncomplicated: Secondary | ICD-10-CM | POA: Insufficient documentation

## 2020-11-05 MED ORDER — PSEUDOEPHEDRINE HCL 60 MG PO TABS: 60.0000 mg | ORAL_TABLET | Freq: Three times a day (TID) | ORAL | 0 refills | Status: AC | PRN

## 2020-11-05 MED ORDER — PREDNISONE 20 MG PO TABS: 40.0000 mg | ORAL_TABLET | Freq: Every day | ORAL | 0 refills | Status: AC

## 2020-11-05 NOTE — ED Provider Notes (Signed)
[facility] EMERGENCY DEPARTMENT Provider Note   CSN: [id number] Arrival date & time: 11/05/20  1608     History Chief Complaint  Patient presents with  . URI    Tracey Cobb is a 49 y.o. female past medical history of asthma and, hypertension who presents for evaluation of persistent cough, nasal congestion, body aches, bilateral ear pain that have been ongoing since 10/24/2020.  She states cough is productive of phlegm.  She states that she feels like when she coughs, makes her ears hurt more.  She has not noted any fever.  She has some associated chest soreness that occurs with coughing.  She states that she feels tired, achy and fatigued.  She was seen on 10/26/2020 for evaluation of symptoms.  She was diagnosed with bronchitis and sent home with prednisone, albuterol.  She states she has used those as well as over-the-counter medication with no improvement in symptoms.  Patient states she has not had any vomiting, difficulty breathing.  She has been vaccinated for Covid.  She does report that she was around her niece at the end of October who had RSV.  She states mom has been at home with similar symptoms.  The history is provided by the patient.       Past Medical History:  Diagnosis Date  . Asthma   . Hypertension   . Ovarian cyst   . Polycystic kidney disease     There are no problems to display for this patient.   History reviewed. No pertinent surgical history.   OB History   No obstetric history on file.     No family history on file.  Social History   Tobacco Use  . Smoking status: Never Smoker  . Smokeless tobacco: Never Used  Vaping Use  . Vaping Use: Never used  Substance Use Topics  . Alcohol use: Not Currently  . Drug use: Not Currently    Home Medications Prior to Admission medications   Medication Sig Start Date End Date Taking? Authorizing Provider  cephALEXin (KEFLEX) 500 MG capsule Take 2 capsules (1,000 mg total) by mouth 2  (two) times daily. 08/31/20   Arby Barrette, MD  HYDROcodone-acetaminophen (NORCO/VICODIN) 5-325 MG tablet Take 1 tablet by mouth every 6 (six) hours as needed for moderate pain or severe pain. 08/31/20   Arby Barrette, MD  ibuprofen (ADVIL) 600 MG tablet Take 1 tablet (600 mg total) by mouth every 8 (eight) hours as needed. 08/31/20   Arby Barrette, MD  ondansetron (ZOFRAN ODT) 4 MG disintegrating tablet Take 1 tablet (4 mg total) by mouth every 4 (four) hours as needed for nausea or vomiting. 08/31/20   Arby Barrette, MD  ondansetron (ZOFRAN) 4 MG tablet Take 1 tablet (4 mg total) by mouth every 6 (six) hours. 10/29/19   Fayrene Helper, PA-C  predniSONE (DELTASONE) 20 MG tablet Take 2 tablets (40 mg total) by mouth daily for 4 days. 11/05/20 11/09/20  Maxwell Caul, PA-C  pseudoephedrine (SUDAFED) 60 MG tablet Take 1 tablet (60 mg total) by mouth every 8 (eight) hours as needed for up to 4 days for congestion. 11/05/20 11/09/20  Maxwell Caul, PA-C    Allergies    Benadryl [diphenhydramine], Ivp dye [iodinated diagnostic agents], and Reglan [metoclopramide]  Review of Systems   Review of Systems  Constitutional: Negative for fever.  HENT: Positive for congestion and rhinorrhea.   Respiratory: Positive for cough. Negative for shortness of breath.   Cardiovascular: Negative for chest pain.  Gastrointestinal: Negative for abdominal pain, nausea and vomiting.  Genitourinary: Negative for dysuria and hematuria.  Musculoskeletal: Positive for myalgias.  Neurological: Negative for headaches.  All other systems reviewed and are negative.   Physical Exam Updated Vital Signs BP 139/80 (BP Location: Left Arm)   Pulse 83   Temp 98 F (36.7 C) (Oral)   Resp 20   Ht 5\' 2"  (1.575 m)   Wt 129.7 kg   SpO2 98%   BMI 52.31 kg/m   Physical Exam Vitals and nursing note reviewed.  Constitutional:      Appearance: She is well-developed.  HENT:     Head: Normocephalic and atraumatic.      Left Ear: Tympanic membrane is erythematous.     Ears:     Comments: Unable to assess right TM secondary to cerumen impaction.  Left TM is erythematous but no bulging, effusion. Eyes:     General: No scleral icterus.       Right eye: No discharge.        Left eye: No discharge.     Conjunctiva/sclera: Conjunctivae normal.  Pulmonary:     Effort: Pulmonary effort is normal.     Breath sounds: Normal breath sounds.     Comments: Intermittently coughing.  No evidence of respiratory distress.  No wheezing, rales noted. Skin:    General: Skin is warm and dry.  Neurological:     Mental Status: She is alert.  Psychiatric:        Speech: Speech normal.        Behavior: Behavior normal.     ED Results / Procedures / Treatments   Labs (all labs ordered are listed, but only abnormal results are displayed) Labs Reviewed - No data to display  EKG None  Radiology DG Chest Portable 1 View  Result Date: 11/05/2020 CLINICAL DATA:  Cough, congestion, body aches. EXAM: PORTABLE CHEST 1 VIEW COMPARISON:  Chest x-ray 10/26/2020 FINDINGS: The heart size and mediastinal contours are within normal limits. Low lung volumes. No focal consolidation. No pulmonary edema. No pleural effusion. No pneumothorax. No acute osseous abnormality. IMPRESSION: No active disease. Electronically Signed   By: 10/28/2020 M.D.   On: 11/05/2020 18:46    Procedures Procedures (including critical care time)  Medications Ordered in ED Medications - No data to display  ED Course  I have reviewed the triage vital signs and the nursing notes.  Pertinent labs & imaging results that were available during my care of the patient were reviewed by me and considered in my medical decision making (see chart for details).    MDM Rules/Calculators/A&P                           49 year old female who presents for evaluation of cough, congestion, body aches, ear pain that has been ongoing for the last several weeks.  Seen  on 10/26/2020 for symptoms.  Covid was negative at time.  Symptoms have persisted.  She states mom is at home with similar symptoms.  Initially arrival, she is afebrile nontoxic-appearing.  Vital signs are stable.  On exam, she is intermittently coughing but no evidence of respiratory distress.  She has been vaccinated for Covid.  We did discuss utility of Covid testing here in the ED but patient declined.  Given that this has been ongoing for several weeks, will obtain chest x-ray for evaluation of possible pneumonia.  Chest x-ray reviewed.  No evidence of acute infectious  etiology.  Discussed results with patient.  She was prescribed albuterol inhaler and prednisone in office visit.  Patient states she never got the prednisone as she was unable to afford it at that time.  We will plan to give her prednisone as well as Sudafed to help with the earache and congestion.  Suspect there may be some eustachian tube dysfunction that could be contributing to her symptoms.  At this time, no indication for antibiotics.  Encouraged at home supportive care measures. At this time, patient exhibits no emergent life-threatening condition that require further evaluation in ED. Patient had ample opportunity for questions and discussion. All patient's questions were answered with full understanding. Strict return precautions discussed. Patient expresses understanding and agreement to plan.   Azilee Pirro was evaluated in Emergency Department on 11/05/2020 for the symptoms described in the history of present illness. She was evaluated in the context of the global COVID-19 pandemic, which necessitated consideration that the patient might be at risk for infection with the SARS-CoV-2 virus that causes COVID-19. Institutional protocols and algorithms that pertain to the evaluation of patients at risk for COVID-19 are in a state of rapid change based on information released by regulatory bodies including the CDC and federal and  state organizations. These policies and algorithms were followed during the patient's care in the ED.  Portions of this note were generated with Scientist, clinical (histocompatibility and immunogenetics). Dictation errors may occur despite best attempts at proofreading.  Final Clinical Impression(s) / ED Diagnoses Final diagnoses:  Viral URI with cough    Rx / DC Orders ED Discharge Orders         Ordered    predniSONE (DELTASONE) 20 MG tablet  Daily        11/05/20 1919    pseudoephedrine (SUDAFED) 60 MG tablet  Every 8 hours PRN        11/05/20 1920           Rosana Hoes 11/05/20 1931    Benjiman Core, MD 11/05/20 2348

## 2020-11-05 NOTE — ED Triage Notes (Signed)
Reports that she had bronchitis a week ago.  Continues to have cough an congestion as well as bil ear pain.  Using inhaler at home.

## 2020-11-05 NOTE — Discharge Instructions (Addendum)
Take prednisone and Sudafed as directed. Return to emergency department for any worsening cough, difficulty breathing, fevers or new worsening concerning symptoms.

## 2020-12-10 ENCOUNTER — Encounter (HOSPITAL_BASED_OUTPATIENT_CLINIC_OR_DEPARTMENT_OTHER): Payer: Self-pay | Admitting: Emergency Medicine

## 2020-12-10 ENCOUNTER — Other Ambulatory Visit: Payer: Self-pay

## 2020-12-10 DIAGNOSIS — R1032 Left lower quadrant pain: Secondary | ICD-10-CM | POA: Insufficient documentation

## 2020-12-10 DIAGNOSIS — Z5321 Procedure and treatment not carried out due to patient leaving prior to being seen by health care provider: Secondary | ICD-10-CM | POA: Insufficient documentation

## 2020-12-10 DIAGNOSIS — R110 Nausea: Secondary | ICD-10-CM | POA: Insufficient documentation

## 2020-12-10 LAB — CBC
HCT: 34.2 % — ABNORMAL LOW (ref 36.0–46.0)
Hemoglobin: 11.2 g/dL — ABNORMAL LOW (ref 12.0–15.0)
MCH: 27.1 pg (ref 26.0–34.0)
MCHC: 32.7 g/dL (ref 30.0–36.0)
MCV: 82.6 fL (ref 80.0–100.0)
Platelets: 272 10*3/uL (ref 150–400)
RBC: 4.14 MIL/uL (ref 3.87–5.11)
RDW: 16.1 % — ABNORMAL HIGH (ref 11.5–15.5)
WBC: 6.3 10*3/uL (ref 4.0–10.5)
nRBC: 0 % (ref 0.0–0.2)

## 2020-12-10 LAB — URINALYSIS, ROUTINE W REFLEX MICROSCOPIC
Bilirubin Urine: NEGATIVE
Glucose, UA: NEGATIVE mg/dL
Hgb urine dipstick: NEGATIVE
Ketones, ur: NEGATIVE mg/dL
Leukocytes,Ua: NEGATIVE
Nitrite: NEGATIVE
Protein, ur: NEGATIVE mg/dL
Specific Gravity, Urine: 1.005 (ref 1.005–1.030)
pH: 6 (ref 5.0–8.0)

## 2020-12-10 LAB — PREGNANCY, URINE: Preg Test, Ur: NEGATIVE

## 2020-12-10 MED ORDER — ONDANSETRON HCL 4 MG/2ML IJ SOLN
4.0000 mg | Freq: Once | INTRAMUSCULAR | Status: AC | PRN
  Administered 2020-12-10: 4 mg via INTRAVENOUS
  Filled 2020-12-10: qty 2

## 2020-12-10 NOTE — ED Triage Notes (Signed)
Reports LLQ abdominal pain since last night with nausea but no vomiting or diarrhea.  Seen at [facility] last night.  Reports she was given morphine then sent home with ibuprofen and meclizine.

## 2020-12-11 ENCOUNTER — Emergency Department (HOSPITAL_BASED_OUTPATIENT_CLINIC_OR_DEPARTMENT_OTHER)
Admission: EM | Admit: 2020-12-11 | Discharge: 2020-12-11 | Disposition: A | Discharge status: Home / Self Care | Payer: Self-pay | Attending: Emergency Medicine | Admitting: Emergency Medicine

## 2020-12-11 LAB — COMPREHENSIVE METABOLIC PANEL
ALT: 100 U/L — ABNORMAL HIGH (ref 0–44)
AST: 141 U/L — ABNORMAL HIGH (ref 15–41)
Albumin: 3.5 g/dL (ref 3.5–5.0)
Alkaline Phosphatase: 134 U/L — ABNORMAL HIGH (ref 38–126)
Anion gap: 9 (ref 5–15)
BUN: 6 mg/dL (ref 6–20)
CO2: 23 mmol/L (ref 22–32)
Calcium: 8.4 mg/dL — ABNORMAL LOW (ref 8.9–10.3)
Chloride: 101 mmol/L (ref 98–111)
Creatinine, Ser: 0.82 mg/dL (ref 0.44–1.00)
GFR, Estimated: >60 mL/min (ref >60)
Glucose, Bld: 127 mg/dL — ABNORMAL HIGH (ref 70–99)
Potassium: 3.6 mmol/L (ref 3.5–5.1)
Sodium: 133 mmol/L — ABNORMAL LOW (ref 135–145)
Total Bilirubin: 0.3 mg/dL (ref 0.3–1.2)
Total Protein: 6.5 g/dL (ref 6.5–8.1)

## 2020-12-11 LAB — LIPASE, BLOOD: Lipase: 33 U/L (ref 11–51)

## 2020-12-11 NOTE — ED Notes (Signed)
Pt requesting to have saline lock taken out. States she is not going to wait to be seen. Encouraged pt to stay.

## 2021-01-01 ENCOUNTER — Ambulatory Visit (HOSPITAL_COMMUNITY)
Admission: EM | Admit: 2021-01-01 | Discharge: 2021-01-01 | Disposition: A | Discharge status: Home / Self Care | Payer: No Payment, Other | Attending: Psychiatry | Admitting: Psychiatry

## 2021-01-01 ENCOUNTER — Other Ambulatory Visit: Payer: Self-pay

## 2021-01-01 ENCOUNTER — Encounter (HOSPITAL_COMMUNITY): Payer: Self-pay | Admitting: Emergency Medicine

## 2021-01-01 DIAGNOSIS — F4323 Adjustment disorder with mixed anxiety and depressed mood: Secondary | ICD-10-CM | POA: Insufficient documentation

## 2021-01-01 DIAGNOSIS — F331 Major depressive disorder, recurrent, moderate: Secondary | ICD-10-CM | POA: Insufficient documentation

## 2021-01-01 HISTORY — DX: Depression, unspecified: F32.A

## 2021-01-01 MED ORDER — FLUOXETINE HCL 20 MG PO CAPS
20.0000 mg | ORAL_CAPSULE | Freq: Every day | ORAL | Status: DC
  Filled 2021-01-01: qty 7

## 2021-01-01 MED ORDER — FLUOXETINE HCL 20 MG PO CAPS: 20.0000 mg | ORAL_CAPSULE | Freq: Every day | ORAL | 0 refills | Status: DC

## 2021-01-01 MED ORDER — TRAZODONE HCL 50 MG PO TABS: 50.0000 mg | ORAL_TABLET | Freq: Every day | ORAL | 0 refills | Status: DC

## 2021-01-01 MED ORDER — TRAZODONE HCL 50 MG PO TABS
50.0000 mg | ORAL_TABLET | Freq: Every day | ORAL | Status: DC
  Filled 2021-01-01: qty 7

## 2021-01-01 MED ORDER — TRAZODONE HCL 50 MG PO TABS
50.0000 mg | ORAL_TABLET | Freq: Every day | ORAL | Status: DC
  Filled 2021-01-01: qty 14

## 2021-01-01 NOTE — Discharge Instructions (Signed)

## 2021-01-01 NOTE — Progress Notes (Signed)
Tracey Cobb is D/C'd home per MD order. Discussed with patient and all questions fully answered. Medication samples were given to pt. An After Visit Summary was printed and given to the patient.  Patient escorted out and D/C home via private auto.  [name]  01/01/2021 4:27 PM

## 2021-01-01 NOTE — ED Triage Notes (Signed)
Pt presented to [facility] as a walk-in requesting to be assessed. Per pt, her husband died 10 days ago and she has not been sleeping or eating a lot. Per pt she found her husband deceased and is having time processing it. Pt denies SI, HI and AVH.

## 2021-01-01 NOTE — ED Provider Notes (Signed)
[facility] Medical Screening Exam  Patient Name: Tracey Cobb MRN: [id number] Date of Evaluation: 01/01/21 Chief Complaint:   Diagnosis:  Final diagnoses:  MDD (major depressive disorder), recurrent episode, moderate (HCC)  Adjustment disorder with mixed anxiety and depressed mood    History of Present illness: Tracey Cobb is a 50 y.o. female.  Patient presented to the [facility] voluntarily as a walk-in reporting worsening depressive symptoms and biggest concern has not been able to sleep.  She reports that her husband of 23 years died 10 days ago and she was the one who found him dead in her room.  She states that he had had some medical problems for a lengthy period of time however has not made it any easier with him passing.  She reports that she has a very supportive family lives with her mother and father.  She states that she is unemployed at the current moment but that her family has came together to be there for her.  She also reports that she is very low on funds and that her sister had to pay for her husband's cremation.  She reports that she has a history of major depressive disorder as well as anxiety.  She reports that last time that she was seen by a psychiatrist or therapy was approximately 5 years ago in [place].  She reports that she did have a previous suicide attempt approximately 25 years ago and one other one when she was approximately 50 years old.  She reports that her biggest concern right now other than feeling depressed is the fact that when she closes her eyes to go to sleep at night she still sees him.  She states that she still has triggers and certain smells or things that she hears that reminds her of him.  She reports that she definitely needs to get into some therapy and would like to restart some medications.  She reported that 5 years ago she was taking Prozac and stated that it helped her tremendously.  After discussing with patient will restart her on  Prozac 20 mg p.o. daily and will start her on trazodone 50 mg p.o. nightly to assist with sleep.  Provided patient with open access hours of operation and informed her to show up for a walk-in appointment.  Patient was also provided with mental health crisis hotline number and is also provided with hospice grief counseling information so that she can be established with them for the loss of her husband.  Patient had denied and continued to deny any suicidal or homicidal ideations and denied any hallucinations.  She continues to report that she feels very safe at home with her family and they are extremely supportive.  Psychiatric Specialty Exam  Presentation  General Appearance:Appropriate for Environment; Casual  Eye Contact:Good  Speech:Clear and Coherent; Normal Rate  Speech Volume:Normal  Handedness:Right   Mood and Affect  Mood:Depressed  Affect:Appropriate; Congruent; Depressed   Thought Process  Thought Processes:Coherent  Descriptions of Associations:Intact  Orientation:Full (Time, Place and Person)  Thought Content:WDL  Hallucinations:None  Ideas of Reference:None  Suicidal Thoughts:No  Homicidal Thoughts:No   Sensorium  Memory:Immediate Good; Recent Good; Remote Good  Judgment:Good  Insight:Good   Executive Functions  Concentration:Good  Attention Span:Good  Recall:Good  Fund of Knowledge:Good  Language:Good   Psychomotor Activity  Psychomotor Activity:Normal   Assets  Assets:Communication Skills; Desire for Improvement; Housing; Transportation; Social Support; Physical Health   Sleep  Sleep:Fair  Number of hours: No data  recorded  Physical Exam: Physical Exam Vitals and nursing note reviewed.  Constitutional:      Appearance: She is well-developed.  HENT:     Head: Normocephalic.  Eyes:     Pupils: Pupils are equal, round, and reactive to light.  Cardiovascular:     Rate and Rhythm: Normal rate.  Pulmonary:     Effort:  Pulmonary effort is normal.  Musculoskeletal:        General: Normal range of motion.  Neurological:     Mental Status: She is alert and oriented to person, place, and time.    Review of Systems  Constitutional: Negative.   HENT: Negative.   Eyes: Negative.   Respiratory: Negative.   Cardiovascular: Negative.   Gastrointestinal: Negative.   Genitourinary: Negative.   Musculoskeletal: Negative.   Skin: Negative.   Neurological: Negative.   Endo/Heme/Allergies: Negative.   Psychiatric/Behavioral: Positive for depression.   Blood pressure (!) 143/91, pulse 78, temperature 98.1 F (36.7 C), temperature source Oral, resp. rate 20, SpO2 96 %. There is no height or weight on file to calculate BMI.  Musculoskeletal: Strength & Muscle Tone: within normal limits Gait & Station: normal Patient leans: N/A   [facility] MSE Discharge Disposition for Follow up and Recommendations: Based on my evaluation the patient does not appear to have an emergency medical condition and can be discharged with resources and follow up care in outpatient services for Medication Management and Individual Therapy   Maryfrances Bunnell, FNP 01/01/2021, 4:05 PM

## 2021-01-02 ENCOUNTER — Telehealth (HOSPITAL_COMMUNITY): Payer: Self-pay | Admitting: General Practice

## 2021-01-02 NOTE — Telephone Encounter (Signed)
Care Management - Follow Up [facility] Discharges   Writer attempted to make contact with patient today and was unsuccessful.  Writer was able to leave a HIPPA compliant voice message and will await callback.   

## 2021-03-11 ENCOUNTER — Other Ambulatory Visit: Payer: Self-pay

## 2021-03-11 ENCOUNTER — Encounter (HOSPITAL_BASED_OUTPATIENT_CLINIC_OR_DEPARTMENT_OTHER): Payer: Self-pay | Admitting: Emergency Medicine

## 2021-03-11 DIAGNOSIS — K2900 Acute gastritis without bleeding: Secondary | ICD-10-CM | POA: Insufficient documentation

## 2021-03-11 DIAGNOSIS — I10 Essential (primary) hypertension: Secondary | ICD-10-CM | POA: Insufficient documentation

## 2021-03-11 DIAGNOSIS — Z79899 Other long term (current) drug therapy: Secondary | ICD-10-CM | POA: Insufficient documentation

## 2021-03-11 DIAGNOSIS — R519 Headache, unspecified: Secondary | ICD-10-CM | POA: Insufficient documentation

## 2021-03-11 DIAGNOSIS — J45909 Unspecified asthma, uncomplicated: Secondary | ICD-10-CM | POA: Insufficient documentation

## 2021-03-11 LAB — URINALYSIS, MICROSCOPIC (REFLEX): RBC / HPF: >50 RBC/hpf (ref 0–5)

## 2021-03-11 LAB — COMPREHENSIVE METABOLIC PANEL
ALT: 24 U/L (ref 0–44)
AST: 22 U/L (ref 15–41)
Albumin: 3.8 g/dL (ref 3.5–5.0)
Alkaline Phosphatase: 86 U/L (ref 38–126)
Anion gap: 8 (ref 5–15)
BUN: 10 mg/dL (ref 6–20)
CO2: 26 mmol/L (ref 22–32)
Calcium: 9.3 mg/dL (ref 8.9–10.3)
Chloride: 103 mmol/L (ref 98–111)
Creatinine, Ser: 0.87 mg/dL (ref 0.44–1.00)
GFR, Estimated: >60 mL/min (ref >60)
Glucose, Bld: 118 mg/dL — ABNORMAL HIGH (ref 70–99)
Potassium: 3.4 mmol/L — ABNORMAL LOW (ref 3.5–5.1)
Sodium: 137 mmol/L (ref 135–145)
Total Bilirubin: 0.4 mg/dL (ref 0.3–1.2)
Total Protein: 7.3 g/dL (ref 6.5–8.1)

## 2021-03-11 LAB — URINALYSIS, ROUTINE W REFLEX MICROSCOPIC
Bilirubin Urine: NEGATIVE
Glucose, UA: NEGATIVE mg/dL
Ketones, ur: NEGATIVE mg/dL
Nitrite: NEGATIVE
Protein, ur: NEGATIVE mg/dL
Specific Gravity, Urine: 1.02 (ref 1.005–1.030)
pH: 8 (ref 5.0–8.0)

## 2021-03-11 LAB — CBC
HCT: 36.5 % (ref 36.0–46.0)
Hemoglobin: 12.1 g/dL (ref 12.0–15.0)
MCH: 28.5 pg (ref 26.0–34.0)
MCHC: 33.2 g/dL (ref 30.0–36.0)
MCV: 86.1 fL (ref 80.0–100.0)
Platelets: 143 10*3/uL — ABNORMAL LOW (ref 150–400)
RBC: 4.24 MIL/uL (ref 3.87–5.11)
RDW: 15 % (ref 11.5–15.5)
WBC: 5.6 10*3/uL (ref 4.0–10.5)
nRBC: 0 % (ref 0.0–0.2)

## 2021-03-11 LAB — LIPASE, BLOOD: Lipase: 38 U/L (ref 11–51)

## 2021-03-11 LAB — PREGNANCY, URINE: Preg Test, Ur: NEGATIVE

## 2021-03-11 NOTE — ED Triage Notes (Signed)
Pt reports epigastric pain, N/V, fatigue and headache; describes pain as sharp/burning

## 2021-03-12 ENCOUNTER — Encounter (HOSPITAL_BASED_OUTPATIENT_CLINIC_OR_DEPARTMENT_OTHER): Payer: Self-pay | Admitting: Emergency Medicine

## 2021-03-12 ENCOUNTER — Emergency Department (HOSPITAL_BASED_OUTPATIENT_CLINIC_OR_DEPARTMENT_OTHER)
Admission: EM | Admit: 2021-03-12 | Discharge: 2021-03-12 | Disposition: A | Discharge status: Home / Self Care | Payer: Self-pay | Attending: Emergency Medicine | Admitting: Emergency Medicine

## 2021-03-12 DIAGNOSIS — R519 Headache, unspecified: Secondary | ICD-10-CM

## 2021-03-12 DIAGNOSIS — K2900 Acute gastritis without bleeding: Secondary | ICD-10-CM

## 2021-03-12 HISTORY — DX: Unspecified nystagmus: H55.00

## 2021-03-12 MED ORDER — PROMETHAZINE HCL 25 MG/ML IJ SOLN
INTRAMUSCULAR | Status: AC
  Administered 2021-03-12: 25 mg
  Filled 2021-03-12: qty 1

## 2021-03-12 MED ORDER — SUCRALFATE 1 G PO TABS: 1.0000 g | ORAL_TABLET | Freq: Three times a day (TID) | ORAL | 0 refills | Status: DC

## 2021-03-12 MED ORDER — KETOROLAC TROMETHAMINE 15 MG/ML IJ SOLN
15.0000 mg | Freq: Once | INTRAMUSCULAR | Status: AC
  Administered 2021-03-12: 15 mg via INTRAVENOUS
  Filled 2021-03-12: qty 1

## 2021-03-12 MED ORDER — SODIUM CHLORIDE 0.9 % IV SOLN
25.0000 mg | Freq: Once | INTRAVENOUS | Status: DC
  Filled 2021-03-12: qty 1

## 2021-03-12 MED ORDER — ONDANSETRON 8 MG PO TBDP: 8.0000 mg | ORAL_TABLET | Freq: Three times a day (TID) | ORAL | 1 refills | Status: DC | PRN

## 2021-03-12 MED ORDER — PANTOPRAZOLE SODIUM 40 MG PO TBEC: DELAYED_RELEASE_TABLET | ORAL | 0 refills | Status: DC

## 2021-03-12 MED ORDER — PANTOPRAZOLE SODIUM 40 MG IV SOLR
40.0000 mg | Freq: Once | INTRAVENOUS | Status: AC
  Administered 2021-03-12: 40 mg via INTRAVENOUS
  Filled 2021-03-12: qty 40

## 2021-03-12 MED ORDER — SODIUM CHLORIDE 0.9 % IV BOLUS
1000.0000 mL | Freq: Once | INTRAVENOUS | Status: AC
  Administered 2021-03-12: 1000 mL via INTRAVENOUS

## 2021-03-12 MED ORDER — SUCRALFATE 1 GM/10ML PO SUSP
1.0000 g | Freq: Once | ORAL | Status: AC
  Administered 2021-03-12: 1 g via ORAL
  Filled 2021-03-12: qty 10

## 2021-03-12 NOTE — ED Provider Notes (Signed)
[facility] Provider Note: Lowella Dell, MD, FACEP  CSN: [id number] MRN: [id number] ARRIVAL: 03/11/21 at 2148 ROOM: MH09/MH09   CHIEF COMPLAINT  Headache   HISTORY OF PRESENT ILLNESS  03/12/21 12:45 AM Tracey Cobb is a 50 y.o. female who has had a headache since yesterday morning.  The headache is diffusely located and throbbing in nature.  She rates it as severe.  She has taken over-the-counter analgesics without relief.  She is having some mild photophobia.  She has a known nystagmus which is not new.  She has also had lower epigastric pain throughout the day which she rates as a 6 out of 10.  It is burning in nature and worse with movement or palpation.  She has had associated nausea with this.  She has not had a fever.  She feels fatigued.  She is currently on her menses.  She is status post cholecystectomy.   Past Medical History:  Diagnosis Date  . Asthma   . Depression   . Hypertension   . Nystagmus   . Ovarian cyst   . Polycystic kidney disease     Past Surgical History:  Procedure Laterality Date  . CESAREAN SECTION    . CHOLECYSTECTOMY      Family History  Adopted: Yes    Social History   Tobacco Use  . Smoking status: Never Smoker  . Smokeless tobacco: Never Used  Vaping Use  . Vaping Use: Former  Substance Use Topics  . Alcohol use: Yes    Alcohol/week: 14.0 standard drinks    Types: 14 Shots of liquor per week    Comment: On 12/30/20  . Drug use: Not Currently    Prior to Admission medications   Medication Sig Start Date End Date Taking? Authorizing Provider  albuterol (VENTOLIN HFA) 108 (90 Base) MCG/ACT inhaler Inhale into the lungs. 03/04/20  Yes [provider]  cephALEXin (KEFLEX) 500 MG capsule Take 2 capsules (1,000 mg total) by mouth 2 (two) times daily. 08/31/20   Arby Barrette, MD  FLUoxetine (PROZAC) 20 MG capsule Take 1 capsule (20 mg total) by mouth daily. 01/01/21 01/31/21  Money, Gerlene Burdock, FNP   HYDROcodone-acetaminophen (NORCO/VICODIN) 5-325 MG tablet Take 1 tablet by mouth every 6 (six) hours as needed for moderate pain or severe pain. 08/31/20   Arby Barrette, MD  ibuprofen (ADVIL) 600 MG tablet Take 1 tablet (600 mg total) by mouth every 8 (eight) hours as needed. 08/31/20   Arby Barrette, MD  ondansetron (ZOFRAN ODT) 4 MG disintegrating tablet Take 1 tablet (4 mg total) by mouth every 4 (four) hours as needed for nausea or vomiting. 08/31/20   Arby Barrette, MD  ondansetron (ZOFRAN) 4 MG tablet Take 1 tablet (4 mg total) by mouth every 6 (six) hours. 10/29/19   Fayrene Helper, PA-C  traZODone (DESYREL) 50 MG tablet Take 1 tablet (50 mg total) by mouth at bedtime. Take 1-2 tablets at bedtime 01/01/21   Money, Gerlene Burdock, FNP    Allergies Benadryl [diphenhydramine], Ivp dye [iodinated diagnostic agents], Reglan [metoclopramide], and Ciprofloxacin   REVIEW OF SYSTEMS  Negative except as noted here or in the History of Present Illness.   PHYSICAL EXAMINATION  Initial Vital Signs Blood pressure (!) 164/100, pulse 76, temperature 97.9 F (36.6 C), temperature source Oral, resp. rate 18, height 5\' 2"  (1.575 m), weight 129.7 kg, last menstrual period 03/08/2021, SpO2 92 %.  Examination General: Well-developed, well-nourished female in no acute distress; appearance consistent with age of  record HENT: normocephalic; atraumatic Eyes: pupils equal, round and reactive to light; extraocular muscles intact; nystagmus Neck: supple Heart: regular rate and rhythm Lungs: clear to auscultation bilaterally Abdomen: soft; nondistended; epigastric tenderness; bowel sounds present Extremities: No deformity; full range of motion; pulses normal Neurologic: Awake, alert and oriented; motor function intact in all extremities and symmetric; no facial droop Skin: Warm and dry Psychiatric: Normal mood and affect   RESULTS  Summary of this visit's results, reviewed and interpreted by myself:   EKG  Interpretation  Date/Time:    Ventricular Rate:    PR Interval:    QRS Duration:   QT Interval:    QTC Calculation:   R Axis:     Text Interpretation:        Laboratory Studies: Results for orders placed or performed during the hospital encounter of 03/12/21 (from the past 24 hour(s))  Lipase, blood     Status: None   Collection Time: 03/11/21 10:23 PM  Result Value Ref Range   Lipase 38 11 - 51 U/L  Comprehensive metabolic panel     Status: Abnormal   Collection Time: 03/11/21 10:23 PM  Result Value Ref Range   Sodium 137 135 - 145 mmol/L   Potassium 3.4 (L) 3.5 - 5.1 mmol/L   Chloride 103 98 - 111 mmol/L   CO2 26 22 - 32 mmol/L   Glucose, Bld 118 (H) 70 - 99 mg/dL   BUN 10 6 - 20 mg/dL   Creatinine, Ser 6.22 0.44 - 1.00 mg/dL   Calcium 9.3 8.9 - 63.3 mg/dL   Total Protein 7.3 6.5 - 8.1 g/dL   Albumin 3.8 3.5 - 5.0 g/dL   AST 22 15 - 41 U/L   ALT 24 0 - 44 U/L   Alkaline Phosphatase 86 38 - 126 U/L   Total Bilirubin 0.4 0.3 - 1.2 mg/dL   GFR, Estimated >35 >45 mL/min   Anion gap 8 5 - 15  CBC     Status: Abnormal   Collection Time: 03/11/21 10:23 PM  Result Value Ref Range   WBC 5.6 4.0 - 10.5 K/uL   RBC 4.24 3.87 - 5.11 MIL/uL   Hemoglobin 12.1 12.0 - 15.0 g/dL   HCT 62.5 63.8 - 93.7 %   MCV 86.1 80.0 - 100.0 fL   MCH 28.5 26.0 - 34.0 pg   MCHC 33.2 30.0 - 36.0 g/dL   RDW 34.2 87.6 - 81.1 %   Platelets 143 (L) 150 - 400 K/uL   nRBC 0.0 0.0 - 0.2 %  Urinalysis, Routine w reflex microscopic Urine, Clean Catch     Status: Abnormal   Collection Time: 03/11/21 10:23 PM  Result Value Ref Range   Color, Urine YELLOW YELLOW   APPearance CLOUDY (A) CLEAR   Specific Gravity, Urine 1.020 1.005 - 1.030   pH 8.0 5.0 - 8.0   Glucose, UA NEGATIVE NEGATIVE mg/dL   Hgb urine dipstick LARGE (A) NEGATIVE   Bilirubin Urine NEGATIVE NEGATIVE   Ketones, ur NEGATIVE NEGATIVE mg/dL   Protein, ur NEGATIVE NEGATIVE mg/dL   Nitrite NEGATIVE NEGATIVE   Leukocytes,Ua TRACE (A)  NEGATIVE  Pregnancy, urine     Status: None   Collection Time: 03/11/21 10:23 PM  Result Value Ref Range   Preg Test, Ur NEGATIVE NEGATIVE  Urinalysis, Microscopic (reflex)     Status: Abnormal   Collection Time: 03/11/21 10:23 PM  Result Value Ref Range   RBC / HPF >50 0 - 5 RBC/hpf  WBC, UA 6-10 0 - 5 WBC/hpf   Bacteria, UA MANY (A) NONE SEEN   Squamous Epithelial / LPF 0-5 0 - 5   Amorphous Crystal PRESENT    Imaging Studies: No results found.  ED COURSE and MDM  Nursing notes, initial and subsequent vitals signs, including pulse oximetry, reviewed and interpreted by myself.  Vitals:   03/12/21 0100 03/12/21 0204 03/12/21 0309 03/12/21 0439  BP: (!) 145/86 134/89 (!) 149/109 (!) 144/89  Pulse: 71 74 71 70  Resp: 20 20 19 17   Temp:  98.1 F (36.7 C) 98.2 F (36.8 C) 98.1 F (36.7 C)  TempSrc:  Oral Oral Oral  SpO2: 100% 96% 98% 96%  Weight:      Height:       Medications  promethazine (PHENERGAN) 25 mg in sodium chloride 0.9 % 50 mL IVPB (0 mg Intravenous Hold 03/12/21 0135)  sodium chloride 0.9 % bolus 1,000 mL (0 mLs Intravenous Stopped 03/12/21 0309)  ketorolac (TORADOL) 15 MG/ML injection 15 mg (15 mg Intravenous Given 03/12/21 0120)  pantoprazole (PROTONIX) injection 40 mg (40 mg Intravenous Given 03/12/21 0120)  promethazine (PHENERGAN) 25 MG/ML injection (25 mg  Given 03/12/21 0120)  sucralfate (CARAFATE) 1 GM/10ML suspension 1 g (1 g Oral Given 03/12/21 0305)   2:46 AM Headache improved after IV medications but stomach is still burning.  Will try Carafate.  4:52 AM Significant relief with Carafate.  I suspect she has gastritis and we will start her on a PPI and Carafate.   PROCEDURES  Procedures   ED DIAGNOSES     ICD-10-CM   1. Generalized headache  R51.9   2. Acute gastritis without hemorrhage, unspecified gastritis type  K29.00        Rema Lievanos, 05/12/21, MD 03/12/21 [phone]

## 2021-04-21 ENCOUNTER — Emergency Department (HOSPITAL_COMMUNITY)
Admission: EM | Admit: 2021-04-21 | Discharge: 2021-04-21 | Disposition: A | Discharge status: Home / Self Care | Payer: Self-pay | Attending: Emergency Medicine | Admitting: Emergency Medicine

## 2021-04-21 ENCOUNTER — Encounter (HOSPITAL_COMMUNITY): Payer: Self-pay | Admitting: Emergency Medicine

## 2021-04-21 ENCOUNTER — Other Ambulatory Visit: Payer: Self-pay

## 2021-04-21 DIAGNOSIS — U071 COVID-19: Secondary | ICD-10-CM | POA: Insufficient documentation

## 2021-04-21 DIAGNOSIS — I10 Essential (primary) hypertension: Secondary | ICD-10-CM | POA: Insufficient documentation

## 2021-04-21 DIAGNOSIS — Z20822 Contact with and (suspected) exposure to covid-19: Secondary | ICD-10-CM

## 2021-04-21 DIAGNOSIS — J45909 Unspecified asthma, uncomplicated: Secondary | ICD-10-CM | POA: Insufficient documentation

## 2021-04-21 NOTE — ED Provider Notes (Signed)
[facility]-EMERGENCY DEPT Provider Note   CSN: [id number] Arrival date & time: 04/21/21  1317     History Chief Complaint  Patient presents with  . Covid Exposure    Tracey Cobb is a 50 y.o. female with past medical history significant for polycystic kidney disease, hypertension, asthma who presents for evaluation of needing COVID test.  Was exposed to COVID 1 week ago.  Apparently took an at home COVID test on Wednesday.  This was positive.  She repeat her COVID test on Thursday which was negative.  She denies any fever, chills, nausea, vomiting, headache, congestion, rhinorrhea, cough, shortness of breath, chest pain, myalgias, abdominal pain, diarrhea, dysuria.  States work told her she needed a COVID test to return.  Denies additional aggravating or alleviating factors.  She denies any current complaints.  History obtained from patient and past medical records.  No interpreter used  HPI     Past Medical History:  Diagnosis Date  . Asthma   . Depression   . Hypertension   . Nystagmus   . Ovarian cyst   . Polycystic kidney disease     There are no problems to display for this patient.   Past Surgical History:  Procedure Laterality Date  . CESAREAN SECTION    . CHOLECYSTECTOMY       OB History   No obstetric history on file.     Family History  Adopted: Yes    Social History   Tobacco Use  . Smoking status: Never Smoker  . Smokeless tobacco: Never Used  Vaping Use  . Vaping Use: Former  Substance Use Topics  . Alcohol use: Yes    Alcohol/week: 14.0 standard drinks    Types: 14 Shots of liquor per week    Comment: On 12/30/20  . Drug use: Not Currently    Home Medications Prior to Admission medications   Medication Sig Start Date End Date Taking? Authorizing Provider  albuterol (VENTOLIN HFA) 108 (90 Base) MCG/ACT inhaler Inhale into the lungs. 03/04/20   [provider]  FLUoxetine (PROZAC) 20 MG capsule Take 1  capsule (20 mg total) by mouth daily. 01/01/21 01/31/21  Money, Gerlene Burdock, FNP  ondansetron (ZOFRAN ODT) 8 MG disintegrating tablet Take 1 tablet (8 mg total) by mouth every 8 (eight) hours as needed for nausea or vomiting. 03/12/21   Molpus, John, MD  pantoprazole (PROTONIX) 40 MG tablet Take 1 tablet every morning at least 30 minutes before first dose of Carafate. 03/12/21   Molpus, John, MD  sucralfate (CARAFATE) 1 g tablet Take 1 tablet (1 g total) by mouth 4 (four) times daily -  with meals and at bedtime. 03/12/21   Molpus, John, MD  traZODone (DESYREL) 50 MG tablet Take 1 tablet (50 mg total) by mouth at bedtime. Take 1-2 tablets at bedtime 01/01/21   Money, Gerlene Burdock, FNP    Allergies    Benadryl [diphenhydramine], Ivp dye [iodinated diagnostic agents], Reglan [metoclopramide], and Ciprofloxacin  Review of Systems   Review of Systems  Constitutional: Negative.   HENT: Negative.   Respiratory: Negative.   Cardiovascular: Negative.   Gastrointestinal: Negative.   Genitourinary: Negative.   Musculoskeletal: Negative.   Skin: Negative.   Neurological: Negative.   All other systems reviewed and are negative.   Physical Exam Updated Vital Signs BP (!) 134/98 (BP Location: Left Arm)   Pulse 79   Temp 97.7 F (36.5 C) (Oral)   Resp 16   Ht 5\' 2"  (  1.575 m)   Wt 121.6 kg   LMP 04/18/2021   SpO2 98%   BMI 49.02 kg/m   Physical Exam Vitals and nursing note reviewed.  Constitutional:      General: She is not in acute distress.    Appearance: She is well-developed. She is obese. She is not ill-appearing, toxic-appearing or diaphoretic.  HENT:     Head: Atraumatic.     Nose: Nose normal.     Mouth/Throat:     Mouth: Mucous membranes are moist.  Eyes:     Pupils: Pupils are equal, round, and reactive to light.  Cardiovascular:     Rate and Rhythm: Normal rate.     Pulses: Normal pulses.     Heart sounds: Normal heart sounds.  Pulmonary:     Effort: Pulmonary effort is normal. No  respiratory distress.     Breath sounds: Normal breath sounds.  Abdominal:     General: Bowel sounds are normal. There is no distension.  Musculoskeletal:        General: Normal range of motion.     Cervical back: Normal range of motion.  Skin:    General: Skin is warm and dry.     Capillary Refill: Capillary refill takes less than 2 seconds.  Neurological:     General: No focal deficit present.     Mental Status: She is alert and oriented to person, place, and time.     ED Results / Procedures / Treatments   Labs (all labs ordered are listed, but only abnormal results are displayed) Labs Reviewed  SARS CORONAVIRUS 2 (TAT 6-24 HRS)    EKG None  Radiology No results found.  Procedures Procedures   Medications Ordered in ED Medications - No data to display  ED Course  I have reviewed the triage vital signs and the nursing notes.  Pertinent labs & imaging results that were available during my care of the patient were reviewed by me and considered in my medical decision making (see chart for details).  50 year old here for needing COVID test.  She is asymptomatic.  Afebrile, nonseptic, not ill-appearing.  Known exposure 1 week ago.  Lungs clear.  Abdomen soft.  Ambulatory by difficulty.  Stable vital signs.  COVID test obtained. She will follow-up with results outpatient. Work note provided  The patient has been appropriately medically screened and/or stabilized in the ED. I have low suspicion for any other emergent medical condition which would require further screening, evaluation or treatment in the ED or require inpatient management.  Patient is hemodynamically stable and in no acute distress.  Patient able to ambulate in department prior to ED.  Evaluation does not show acute pathology that would require ongoing or additional emergent interventions while in the emergency department or further inpatient treatment.  I have discussed the diagnosis with the patient and  answered all questions.  Pain is been managed while in the emergency department and patient has no further complaints prior to discharge.  Patient is comfortable with plan discussed in room and is stable for discharge at this time.  I have discussed strict return precautions for returning to the emergency department.  Patient was encouraged to follow-up with PCP/specialist refer to at discharge.    MDM Rules/Calculators/A&P                          Tracey Cobb was evaluated in Emergency Department on 04/21/2021 for the symptoms described in the history  of present illness. She was evaluated in the context of the global COVID-19 pandemic, which necessitated consideration that the patient might be at risk for infection with the SARS-CoV-2 virus that causes COVID-19. Institutional protocols and algorithms that pertain to the evaluation of patients at risk for COVID-19 are in a state of rapid change based on information released by regulatory bodies including the CDC and federal and state organizations. These policies and algorithms were followed during the patient's care in the ED. Final Clinical Impression(s) / ED Diagnoses Final diagnoses:  Close exposure to COVID-19 virus    Rx / DC Orders ED Discharge Orders    None       Adron Geisel A, PA-C 04/21/21 1420    Little, Ambrose Finland, MD 04/22/21 [phone]

## 2021-04-21 NOTE — ED Triage Notes (Signed)
Pt states that she was exposed to Covid a week ago and had one positive test on Wednesday and a negative one on Thursday. Denies symptoms. Alert and oriented.

## 2021-04-22 LAB — SARS CORONAVIRUS 2 (TAT 6-24 HRS): SARS Coronavirus 2: POSITIVE — AB

## 2021-04-23 ENCOUNTER — Telehealth: Payer: Self-pay | Admitting: Oncology

## 2021-04-23 NOTE — Telephone Encounter (Signed)
error 

## 2021-04-24 ENCOUNTER — Emergency Department (HOSPITAL_COMMUNITY)
Admission: EM | Admit: 2021-04-24 | Discharge: 2021-04-24 | Disposition: A | Discharge status: Home / Self Care | Payer: Self-pay | Attending: Emergency Medicine | Admitting: Emergency Medicine

## 2021-04-24 ENCOUNTER — Encounter (HOSPITAL_COMMUNITY): Payer: Self-pay

## 2021-04-24 ENCOUNTER — Other Ambulatory Visit: Payer: Self-pay

## 2021-04-24 DIAGNOSIS — I10 Essential (primary) hypertension: Secondary | ICD-10-CM | POA: Insufficient documentation

## 2021-04-24 DIAGNOSIS — Z7951 Long term (current) use of inhaled steroids: Secondary | ICD-10-CM | POA: Insufficient documentation

## 2021-04-24 DIAGNOSIS — J45909 Unspecified asthma, uncomplicated: Secondary | ICD-10-CM | POA: Insufficient documentation

## 2021-04-24 DIAGNOSIS — U071 COVID-19: Secondary | ICD-10-CM | POA: Insufficient documentation

## 2021-04-24 NOTE — ED Triage Notes (Signed)
Patient states that she needs a Covid test so she can possibly go back to work.

## 2021-04-24 NOTE — ED Provider Notes (Signed)
[facility]-EMERGENCY DEPT Provider Note   CSN: [id number] Arrival date & time: 04/24/21  1438     History No chief complaint on file.   Tracey Cobb is a 50 y.o. female.  50 year old female requesting a repeat COVID test.  Patient tested positive for COVID 3 days ago.  She has no current symptoms at this time.  States that she was exposed 7 days ago.  Needs this to return to work.        Past Medical History:  Diagnosis Date  . Asthma   . Depression   . Hypertension   . Nystagmus   . Ovarian cyst   . Polycystic kidney disease     There are no problems to display for this patient.   Past Surgical History:  Procedure Laterality Date  . CESAREAN SECTION    . CHOLECYSTECTOMY       OB History   No obstetric history on file.     Family History  Adopted: Yes    Social History   Tobacco Use  . Smoking status: Never Smoker  . Smokeless tobacco: Never Used  Vaping Use  . Vaping Use: Former  Substance Use Topics  . Alcohol use: Yes    Alcohol/week: 14.0 standard drinks    Types: 14 Shots of liquor per week    Comment: On 12/30/20  . Drug use: Not Currently    Home Medications Prior to Admission medications   Medication Sig Start Date End Date Taking? Authorizing Provider  albuterol (VENTOLIN HFA) 108 (90 Base) MCG/ACT inhaler Inhale into the lungs. 03/04/20   [provider]  FLUoxetine (PROZAC) 20 MG capsule Take 1 capsule (20 mg total) by mouth daily. 01/01/21 01/31/21  Money, Gerlene Burdock, FNP  ondansetron (ZOFRAN ODT) 8 MG disintegrating tablet Take 1 tablet (8 mg total) by mouth every 8 (eight) hours as needed for nausea or vomiting. 03/12/21   Molpus, John, MD  pantoprazole (PROTONIX) 40 MG tablet Take 1 tablet every morning at least 30 minutes before first dose of Carafate. 03/12/21   Molpus, John, MD  sucralfate (CARAFATE) 1 g tablet Take 1 tablet (1 g total) by mouth 4 (four) times daily -  with meals and at bedtime. 03/12/21    Molpus, John, MD  traZODone (DESYREL) 50 MG tablet Take 1 tablet (50 mg total) by mouth at bedtime. Take 1-2 tablets at bedtime 01/01/21   Money, Gerlene Burdock, FNP    Allergies    Benadryl [diphenhydramine], Ivp dye [iodinated diagnostic agents], Reglan [metoclopramide], and Ciprofloxacin  Review of Systems   Review of Systems  All other systems reviewed and are negative.   Physical Exam Updated Vital Signs BP (!) 136/97 (BP Location: Left Arm)   Pulse 77   Temp 98.2 F (36.8 C) (Oral)   Resp 18   Ht 1.575 m (5\' 2" )   Wt 121.6 kg   LMP 04/18/2021   SpO2 94%   BMI 49.02 kg/m   Physical Exam Vitals and nursing note reviewed.  Constitutional:      General: She is not in acute distress.    Appearance: Normal appearance. She is well-developed. She is not toxic-appearing.  HENT:     Head: Normocephalic and atraumatic.  Eyes:     General: Lids are normal.     Conjunctiva/sclera: Conjunctivae normal.     Pupils: Pupils are equal, round, and reactive to light.  Neck:     Thyroid: No thyroid mass.     Trachea:  No tracheal deviation.  Cardiovascular:     Rate and Rhythm: Normal rate and regular rhythm.     Heart sounds: Normal heart sounds. No murmur heard. No gallop.   Pulmonary:     Effort: Pulmonary effort is normal. No respiratory distress.     Breath sounds: Normal breath sounds. No stridor. No decreased breath sounds, wheezing, rhonchi or rales.  Abdominal:     General: Bowel sounds are normal. There is no distension.     Palpations: Abdomen is soft.     Tenderness: There is no abdominal tenderness. There is no rebound.  Musculoskeletal:        General: No tenderness. Normal range of motion.     Cervical back: Normal range of motion and neck supple.  Skin:    General: Skin is warm and dry.     Findings: No abrasion or rash.  Neurological:     Mental Status: She is alert and oriented to person, place, and time.     GCS: GCS eye subscore is 4. GCS verbal subscore is 5.  GCS motor subscore is 6.     Cranial Nerves: No cranial nerve deficit.     Sensory: No sensory deficit.  Psychiatric:        Speech: Speech normal.        Behavior: Behavior normal.     ED Results / Procedures / Treatments   Labs (all labs ordered are listed, but only abnormal results are displayed) Labs Reviewed - No data to display  EKG None  Radiology No results found.  Procedures Procedures   Medications Ordered in ED Medications - No data to display  ED Course  I have reviewed the triage vital signs and the nursing notes.  Pertinent labs & imaging results that were available during my care of the patient were reviewed by me and considered in my medical decision making (see chart for details).    MDM Rules/Calculators/A&P                          Patient asymptomatic at this time.  Recent COVID test that was +3 days ago.  No indication for repeat testing.  Informed her that she is free to go to pharmacy to have this performed.  Patient became very adamant that she needed a repeat test.  I informed her that this would not indicate at this time because she tested positive just a few days ago.  She would need to wait at least 5 days after her last positive test to be retested again.  She was never happy with this conversation. Final Clinical Impression(s) / ED Diagnoses Final diagnoses:  None    Rx / DC Orders ED Discharge Orders    None       Lorre Nick, MD 04/24/21 1530

## 2021-04-24 NOTE — Discharge Instructions (Addendum)
Please make an appointment with a [facility] or [facility] for a repeat COVID test within the next 3 to 5 days

## 2021-04-24 NOTE — ED Notes (Signed)
Pt discharged with Dr. Freida Busman present. Pt argumentative. Pt educated that she needed to wait 3-5 days since last COVID test to re-test. Pt had a COVID+ test 3 days ago. Pt leaving her room, tearing her mask off her face, yelling at staff "I hope you all get COVID!".   An After Visit Summary was printed and given to the patient. Discharge instructions given and no further questions at this time.

## 2021-05-12 ENCOUNTER — Emergency Department (HOSPITAL_BASED_OUTPATIENT_CLINIC_OR_DEPARTMENT_OTHER)
Admission: EM | Admit: 2021-05-12 | Discharge: 2021-05-13 | Disposition: A | Discharge status: Home / Self Care | Payer: Self-pay | Attending: Emergency Medicine | Admitting: Emergency Medicine

## 2021-05-12 ENCOUNTER — Encounter (HOSPITAL_BASED_OUTPATIENT_CLINIC_OR_DEPARTMENT_OTHER): Payer: Self-pay | Admitting: *Deleted

## 2021-05-12 ENCOUNTER — Other Ambulatory Visit: Payer: Self-pay

## 2021-05-12 ENCOUNTER — Emergency Department (HOSPITAL_BASED_OUTPATIENT_CLINIC_OR_DEPARTMENT_OTHER): Payer: Self-pay

## 2021-05-12 DIAGNOSIS — I10 Essential (primary) hypertension: Secondary | ICD-10-CM | POA: Insufficient documentation

## 2021-05-12 DIAGNOSIS — J45909 Unspecified asthma, uncomplicated: Secondary | ICD-10-CM | POA: Insufficient documentation

## 2021-05-12 DIAGNOSIS — S82002A Unspecified fracture of left patella, initial encounter for closed fracture: Secondary | ICD-10-CM | POA: Insufficient documentation

## 2021-05-12 DIAGNOSIS — X501XXA Overexertion from prolonged static or awkward postures, initial encounter: Secondary | ICD-10-CM | POA: Insufficient documentation

## 2021-05-12 DIAGNOSIS — Y990 Civilian activity done for income or pay: Secondary | ICD-10-CM | POA: Insufficient documentation

## 2021-05-12 MED ORDER — ETODOLAC 300 MG PO CAPS: 300.0000 mg | ORAL_CAPSULE | Freq: Three times a day (TID) | ORAL | 0 refills | Status: DC

## 2021-05-12 MED ORDER — IBUPROFEN 400 MG PO TABS
600.0000 mg | ORAL_TABLET | Freq: Once | ORAL | Status: AC
  Administered 2021-05-12: 600 mg via ORAL
  Filled 2021-05-12: qty 1

## 2021-05-12 NOTE — ED Triage Notes (Signed)
Pt reports left knee pain which started after she was pushing grocery carts uphill at work (she works at [employer]).

## 2021-05-12 NOTE — ED Provider Notes (Signed)
[facility] EMERGENCY DEPARTMENT Provider Note   CSN: [id number] Arrival date & time: 05/12/21  2156     History Chief Complaint  Patient presents with  . Knee Injury    Tracey Cobb is a 50 y.o. female.  HPI   Patient presents to the ED with complaints of left knee pain.  Patient states the symptoms started yesterday.  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  She is has been taking Tylenol as well as using Voltaren gel.  Patient states the pain is primarily in the medial aspect of her knee.  It increases when she tries to walk.  She denies any fevers or chills.  No falls.  No numbness or weakness.  Past Medical History:  Diagnosis Date  . Asthma   . Depression   . Hypertension   . Nystagmus   . Ovarian cyst   . Polycystic kidney disease     There are no problems to display for this patient.   Past Surgical History:  Procedure Laterality Date  . CESAREAN SECTION    . CHOLECYSTECTOMY       OB History   No obstetric history on file.     Family History  Adopted: Yes    Social History   Tobacco Use  . Smoking status: Never Smoker  . Smokeless tobacco: Never Used  Vaping Use  . Vaping Use: Former  Substance Use Topics  . Alcohol use: Yes    Alcohol/week: 14.0 standard drinks    Types: 14 Shots of liquor per week    Comment: On 12/30/20  . Drug use: Not Currently    Home Medications Prior to Admission medications   Medication Sig Start Date End Date Taking? Authorizing Provider  etodolac (LODINE) 300 MG capsule Take 1 capsule (300 mg total) by mouth every 8 (eight) hours. 05/12/21  Yes Linwood Dibbles, MD  albuterol (VENTOLIN HFA) 108 (90 Base) MCG/ACT inhaler Inhale into the lungs. 03/04/20   [provider]  FLUoxetine (PROZAC) 20 MG capsule Take 1 capsule (20 mg total) by mouth daily. 01/01/21 01/31/21  Money, Gerlene Burdock, FNP  ondansetron (ZOFRAN ODT) 8 MG disintegrating tablet Take 1 tablet (8 mg total) by mouth every 8  (eight) hours as needed for nausea or vomiting. 03/12/21   Molpus, John, MD  pantoprazole (PROTONIX) 40 MG tablet Take 1 tablet every morning at least 30 minutes before first dose of Carafate. 03/12/21   Molpus, John, MD  sucralfate (CARAFATE) 1 g tablet Take 1 tablet (1 g total) by mouth 4 (four) times daily -  with meals and at bedtime. 03/12/21   Molpus, John, MD  traZODone (DESYREL) 50 MG tablet Take 1 tablet (50 mg total) by mouth at bedtime. Take 1-2 tablets at bedtime 01/01/21   Money, Gerlene Burdock, FNP    Allergies    Benadryl [diphenhydramine], Ivp dye [iodinated diagnostic agents], Reglan [metoclopramide], and Ciprofloxacin  Review of Systems   Review of Systems  All other systems reviewed and are negative.   Physical Exam Updated Vital Signs BP 130/90 (BP Location: Right Arm)   Pulse 77   Temp 98.7 F (37.1 C) (Oral)   Resp 18   Ht 1.575 m (5\' 2" )   Wt 121.6 kg   LMP 04/18/2021   SpO2 97%   BMI 49.02 kg/m   Physical Exam Vitals and nursing note reviewed.  Constitutional:      General: She is not in acute distress.  Appearance: She is well-developed.  HENT:     Head: Normocephalic and atraumatic.     Right Ear: External ear normal.     Left Ear: External ear normal.  Eyes:     General: No scleral icterus.       Right eye: No discharge.        Left eye: No discharge.     Conjunctiva/sclera: Conjunctivae normal.  Neck:     Trachea: No tracheal deviation.  Cardiovascular:     Rate and Rhythm: Normal rate.  Pulmonary:     Effort: Pulmonary effort is normal. No respiratory distress.     Breath sounds: No stridor.  Abdominal:     General: There is no distension.  Musculoskeletal:        General: Tenderness present. No swelling or deformity.     Cervical back: Neck supple.     Comments: Tenderness palpation medial aspect of left knee, no erythema, no gross deformity  Skin:    General: Skin is warm and dry.     Findings: No rash.  Neurological:     Mental Status:  She is alert.     Cranial Nerves: Cranial nerve deficit: no gross deficits.     ED Results / Procedures / Treatments   Labs (all labs ordered are listed, but only abnormal results are displayed) Labs Reviewed - No data to display  EKG None  Radiology DG Knee Complete 4 Views Left  Result Date: 05/12/2021 CLINICAL DATA:  LEFT knee pain after injury. EXAM: LEFT KNEE - COMPLETE 4+ VIEW COMPARISON:  None. FINDINGS: Deformity of the patella, compatible with fracture, of uncertain age but most likely chronic Distal femur appears intact and normally aligned. Proximal tibia and fibula appear intact and normally aligned. Mild degenerative spurring noted at the medial compartment. No appreciable joint effusion and adjacent soft tissues are unremarkable. IMPRESSION: 1. Fracture deformity of the patella, of uncertain age but most likely chronic. 2. No acute-appearing osseous abnormality. 3. Mild degenerative change at the medial compartment. Lateral and patellofemoral compartments appear relatively well preserved. Electronically Signed   By: Bary Richard M.D.   On: 05/12/2021 23:04    Procedures Procedures   Medications Ordered in ED Medications  ibuprofen (ADVIL) tablet 600 mg (has no administration in time range)    ED Course  I have reviewed the triage vital signs and the nursing notes.  Pertinent labs & imaging results that were available during my care of the patient were reviewed by me and considered in my medical decision making (see chart for details).    MDM Rules/Calculators/A&P                          X-ray showed fracture of undetermined age.  Not likely related to this recent injury she has not fallen.  Will place patient in knee immobilizer and given crutches.  Recommend outpatient follow-up with sports medicine Final Clinical Impression(s) / ED Diagnoses Final diagnoses:  Closed nondisplaced fracture of left patella, unspecified fracture morphology, initial encounter     Rx / DC Orders ED Discharge Orders         Ordered    etodolac (LODINE) 300 MG capsule  Every 8 hours       Note to Pharmacy: As needed for pain   05/12/21 2320           Linwood Dibbles, MD 05/12/21 2324

## 2021-05-12 NOTE — Discharge Instructions (Signed)
The x-ray showed a probable patella fracture.  This is likely not a new finding.  Follow-up with a sports medicine orthopedic doctor for further evaluation

## 2021-06-06 ENCOUNTER — Other Ambulatory Visit: Payer: Self-pay

## 2021-06-06 ENCOUNTER — Emergency Department (HOSPITAL_BASED_OUTPATIENT_CLINIC_OR_DEPARTMENT_OTHER)
Admission: EM | Admit: 2021-06-06 | Discharge: 2021-06-06 | Disposition: A | Discharge status: Home / Self Care | Payer: Self-pay | Attending: Emergency Medicine | Admitting: Emergency Medicine

## 2021-06-06 ENCOUNTER — Encounter (HOSPITAL_BASED_OUTPATIENT_CLINIC_OR_DEPARTMENT_OTHER): Payer: Self-pay

## 2021-06-06 DIAGNOSIS — R1032 Left lower quadrant pain: Secondary | ICD-10-CM | POA: Insufficient documentation

## 2021-06-06 DIAGNOSIS — J45909 Unspecified asthma, uncomplicated: Secondary | ICD-10-CM | POA: Insufficient documentation

## 2021-06-06 DIAGNOSIS — R300 Dysuria: Secondary | ICD-10-CM | POA: Insufficient documentation

## 2021-06-06 DIAGNOSIS — I10 Essential (primary) hypertension: Secondary | ICD-10-CM | POA: Insufficient documentation

## 2021-06-06 LAB — CBC WITH DIFFERENTIAL/PLATELET
Abs Immature Granulocytes: 0.01 10*3/uL (ref 0.00–0.07)
Basophils Absolute: 0.1 10*3/uL (ref 0.0–0.1)
Basophils Relative: 1 %
Eosinophils Absolute: 0.2 10*3/uL (ref 0.0–0.5)
Eosinophils Relative: 3 %
HCT: 36.6 % (ref 36.0–46.0)
Hemoglobin: 12.3 g/dL (ref 12.0–15.0)
Immature Granulocytes: 0 %
Lymphocytes Relative: 34 %
Lymphs Abs: 2.5 10*3/uL (ref 0.7–4.0)
MCH: 29.4 pg (ref 26.0–34.0)
MCHC: 33.6 g/dL (ref 30.0–36.0)
MCV: 87.4 fL (ref 80.0–100.0)
Monocytes Absolute: 0.8 10*3/uL (ref 0.1–1.0)
Monocytes Relative: 11 %
Neutro Abs: 3.7 10*3/uL (ref 1.7–7.7)
Neutrophils Relative %: 51 %
Platelets: 221 10*3/uL (ref 150–400)
RBC: 4.19 MIL/uL (ref 3.87–5.11)
RDW: 14.4 % (ref 11.5–15.5)
WBC: 7.3 10*3/uL (ref 4.0–10.5)
nRBC: 0 % (ref 0.0–0.2)

## 2021-06-06 LAB — URINALYSIS, ROUTINE W REFLEX MICROSCOPIC
Bilirubin Urine: NEGATIVE
Glucose, UA: NEGATIVE mg/dL
Hgb urine dipstick: NEGATIVE
Ketones, ur: NEGATIVE mg/dL
Leukocytes,Ua: NEGATIVE
Nitrite: NEGATIVE
Protein, ur: NEGATIVE mg/dL
Specific Gravity, Urine: >1.03 — ABNORMAL HIGH (ref 1.005–1.030)
pH: 5.5 (ref 5.0–8.0)

## 2021-06-06 LAB — BASIC METABOLIC PANEL
Anion gap: 7 (ref 5–15)
BUN: 14 mg/dL (ref 6–20)
CO2: 27 mmol/L (ref 22–32)
Calcium: 9.1 mg/dL (ref 8.9–10.3)
Chloride: 103 mmol/L (ref 98–111)
Creatinine, Ser: 0.77 mg/dL (ref 0.44–1.00)
GFR, Estimated: >60 mL/min (ref >60)
Glucose, Bld: 129 mg/dL — ABNORMAL HIGH (ref 70–99)
Potassium: 3.8 mmol/L (ref 3.5–5.1)
Sodium: 137 mmol/L (ref 135–145)

## 2021-06-06 LAB — PREGNANCY, URINE: Preg Test, Ur: NEGATIVE

## 2021-06-06 MED ORDER — FENTANYL CITRATE (PF) 100 MCG/2ML IJ SOLN
100.0000 ug | Freq: Once | INTRAMUSCULAR | Status: AC
Start: 2021-06-06 — End: 2021-06-06
  Administered 2021-06-06: 100 ug via INTRAVENOUS
  Filled 2021-06-06: qty 2

## 2021-06-06 MED ORDER — ONDANSETRON HCL 4 MG/2ML IJ SOLN
4.0000 mg | Freq: Once | INTRAMUSCULAR | Status: AC
  Administered 2021-06-06: 4 mg via INTRAVENOUS
  Filled 2021-06-06: qty 2

## 2021-06-06 MED ORDER — HYDROCODONE-ACETAMINOPHEN 5-325 MG PO TABS: 1.0000 | ORAL_TABLET | Freq: Four times a day (QID) | ORAL | 0 refills | Status: DC | PRN

## 2021-06-06 MED ORDER — SODIUM CHLORIDE 0.9 % IV BOLUS
1000.0000 mL | Freq: Once | INTRAVENOUS | Status: AC
  Administered 2021-06-06: 1000 mL via INTRAVENOUS

## 2021-06-06 NOTE — ED Triage Notes (Signed)
Pt presents with 3 day hx of L groin pain with radiation to the back. Pt states pain is similar to previous kidney stones or ovarian cysts. Pt reports temporary relief with APAP.

## 2021-06-06 NOTE — ED Provider Notes (Signed)
[facility] EMERGENCY DEPARTMENT Provider Note   CSN: [id number] Arrival date & time: 06/06/21  0224     History Chief Complaint  Patient presents with   Groin Pain    Tracey Cobb is a 50 y.o. female.  The history is provided by the patient.  Groin Pain This is a new problem. The current episode started more than 2 days ago. The problem occurs daily. The problem has been gradually worsening. Associated symptoms include abdominal pain. Pertinent negatives include no chest pain and no shortness of breath. Nothing aggravates the symptoms. Nothing relieves the symptoms.  Patient reports she feels that she has a kidney stone   She reports over the past 3 days she has had left lower quadrant/groin pain as well as some flank pain.  She also reports dysuria She reports this feels similar to prior episodes of kidney stone.  Reports previous history of lithotripsy  Past Medical History:  Diagnosis Date   Asthma    Depression    Hypertension    Nystagmus    Ovarian cyst    Polycystic kidney disease     There are no problems to display for this patient.   Past Surgical History:  Procedure Laterality Date   CESAREAN SECTION     CHOLECYSTECTOMY       OB History   No obstetric history on file.     Family History  Adopted: Yes    Social History   Tobacco Use   Smoking status: Never   Smokeless tobacco: Never  Vaping Use   Vaping Use: Former  Substance Use Topics   Alcohol use: Yes    Alcohol/week: 14.0 standard drinks    Types: 14 Shots of liquor per week    Comment: On 12/30/20   Drug use: Not Currently    Home Medications Prior to Admission medications   Medication Sig Start Date End Date Taking? Authorizing Provider  albuterol (VENTOLIN HFA) 108 (90 Base) MCG/ACT inhaler Inhale into the lungs. 03/04/20   [provider]  etodolac (LODINE) 300 MG capsule Take 1 capsule (300 mg total) by mouth every 8 (eight) hours. 05/12/21   Linwood Dibbles, MD   FLUoxetine (PROZAC) 20 MG capsule Take 1 capsule (20 mg total) by mouth daily. 01/01/21 01/31/21  Money, Gerlene Burdock, FNP  ondansetron (ZOFRAN ODT) 8 MG disintegrating tablet Take 1 tablet (8 mg total) by mouth every 8 (eight) hours as needed for nausea or vomiting. 03/12/21   Molpus, John, MD  pantoprazole (PROTONIX) 40 MG tablet Take 1 tablet every morning at least 30 minutes before first dose of Carafate. 03/12/21   Molpus, John, MD  sucralfate (CARAFATE) 1 g tablet Take 1 tablet (1 g total) by mouth 4 (four) times daily -  with meals and at bedtime. 03/12/21   Molpus, John, MD  traZODone (DESYREL) 50 MG tablet Take 1 tablet (50 mg total) by mouth at bedtime. Take 1-2 tablets at bedtime 01/01/21   Money, Gerlene Burdock, FNP    Allergies    Benadryl [diphenhydramine], Ivp dye [iodinated diagnostic agents], Reglan [metoclopramide], and Ciprofloxacin  Review of Systems   Review of Systems  Constitutional:  Negative for fever.  Respiratory:  Negative for shortness of breath.   Cardiovascular:  Negative for chest pain.  Gastrointestinal:  Positive for abdominal pain.  Genitourinary:  Positive for dysuria. Negative for vaginal bleeding.  All other systems reviewed and are negative.  Physical Exam Updated Vital Signs BP 131/87   Pulse 65  Temp 98.2 F (36.8 C) (Oral)   Resp 20   Ht 1.575 m (5\' 2" )   Wt 121.6 kg   LMP 05/01/2021   SpO2 96%   BMI 49.02 kg/m   Physical Exam CONSTITUTIONAL: Well developed/well nourished, uncomfortable appearing HEAD: Normocephalic/atraumatic EYES: EOMI/PERRL ENMT: Mucous membranes moist NECK: supple no meningeal signs SPINE/BACK:entire spine nontender CV: S1/S2 noted, no murmurs/rubs/gallops noted LUNGS: Lungs are clear to auscultation bilaterally, no apparent distress ABDOMEN: soft, mild LLQ tenderness, no rebound or guarding, bowel sounds noted throughout abdomen, obese No abdominal wall hernia.  No erythema or abscess noted GU:no cva tenderness NEURO: Pt is  awake/alert/appropriate, moves all extremitiesx4.  No facial droop.   EXTREMITIES: pulses normal/equal, full ROM SKIN: warm, color normal PSYCH: Mildly anxious  ED Results / Procedures / Treatments   Labs (all labs ordered are listed, but only abnormal results are displayed) Labs Reviewed  URINALYSIS, ROUTINE W REFLEX MICROSCOPIC - Abnormal; Notable for the following components:      Result Value   Specific Gravity, Urine >1.030 (*)    All other components within normal limits  BASIC METABOLIC PANEL - Abnormal; Notable for the following components:   Glucose, Bld 129 (*)    All other components within normal limits  PREGNANCY, URINE  CBC WITH DIFFERENTIAL/PLATELET    EKG None  Radiology No results found.  Procedures Procedures   Medications Ordered in ED Medications  fentaNYL (SUBLIMAZE) injection 100 mcg (has no administration in time range)  ondansetron (ZOFRAN) injection 4 mg (has no administration in time range)  sodium chloride 0.9 % bolus 1,000 mL (has no administration in time range)    ED Course  I have reviewed the triage vital signs and the nursing notes.  Pertinent labs  results that were available during my care of the patient were reviewed by me and considered in my medical decision making (see chart for details).    MDM Rules/Calculators/A&P                          6:28 AM Patient presents that she feels like she has another kidney stone.  She appears uncomfortable.  However there is no hematuria or signs of UTI.  Review of chart reveals patient has had multiple CT scans within and outside of [place] for kidney stones.  Typical findings include bilateral nonobstructing stones.  We will treat pain and reassess 7:26 AM Patient is improved.  No focal tenderness. Patient has had  extensive imaging before that revealed bilateral stones.  Most recent CT imaging does not reveal any adnexal pathology.  Patient reports previous history of ovarian cyst At  this time given her stability, suspicion for acute abdominal or gynecologic pregnancy is low Will defer imaging at this time. We discussed return precautions.  Short course of pain medicine given in case this is a small ureteral stone that is passing Final Clinical Impression(s) / ED Diagnoses Final diagnoses:  Left lower quadrant abdominal pain    Rx / DC Orders ED Discharge Orders          Ordered    HYDROcodone-acetaminophen (NORCO/VICODIN) 5-325 MG tablet  Every 6 hours PRN        06/06/21 0723             06/08/21, MD 06/06/21 [phone]

## 2021-06-06 NOTE — ED Notes (Signed)
Pt saturations fell to 81% post fentanyl administration.  Pt easily awakened, deep breaths improved saturation to 87%.  2lbnc applied with improvement to 96%.  Pt states "that always happens when I get pain meds"

## 2021-06-06 NOTE — ED Notes (Signed)
Transport necessity incorrectly charted. Disregard

## 2021-06-06 NOTE — Discharge Instructions (Addendum)

## 2021-07-05 ENCOUNTER — Other Ambulatory Visit: Payer: Self-pay

## 2021-07-05 ENCOUNTER — Encounter (HOSPITAL_BASED_OUTPATIENT_CLINIC_OR_DEPARTMENT_OTHER): Payer: Self-pay | Admitting: *Deleted

## 2021-07-05 ENCOUNTER — Emergency Department (HOSPITAL_BASED_OUTPATIENT_CLINIC_OR_DEPARTMENT_OTHER)
Admission: EM | Admit: 2021-07-05 | Discharge: 2021-07-06 | Disposition: A | Discharge status: Home / Self Care | Payer: Self-pay | Attending: Emergency Medicine | Admitting: Emergency Medicine

## 2021-07-05 DIAGNOSIS — R109 Unspecified abdominal pain: Secondary | ICD-10-CM

## 2021-07-05 DIAGNOSIS — R110 Nausea: Secondary | ICD-10-CM | POA: Insufficient documentation

## 2021-07-05 DIAGNOSIS — R1033 Periumbilical pain: Secondary | ICD-10-CM | POA: Insufficient documentation

## 2021-07-05 DIAGNOSIS — J45909 Unspecified asthma, uncomplicated: Secondary | ICD-10-CM | POA: Insufficient documentation

## 2021-07-05 DIAGNOSIS — I10 Essential (primary) hypertension: Secondary | ICD-10-CM | POA: Insufficient documentation

## 2021-07-05 DIAGNOSIS — R197 Diarrhea, unspecified: Secondary | ICD-10-CM | POA: Insufficient documentation

## 2021-07-05 NOTE — ED Triage Notes (Signed)
C/o chronic abd pain x 3 months

## 2021-07-06 LAB — LIPASE, BLOOD: Lipase: 34 U/L (ref 11–51)

## 2021-07-06 LAB — CBC WITH DIFFERENTIAL/PLATELET
Abs Immature Granulocytes: 0.01 10*3/uL (ref 0.00–0.07)
Basophils Absolute: 0.1 10*3/uL (ref 0.0–0.1)
Basophils Relative: 1 %
Eosinophils Absolute: 0.2 10*3/uL (ref 0.0–0.5)
Eosinophils Relative: 3 %
HCT: 36.8 % (ref 36.0–46.0)
Hemoglobin: 12.2 g/dL (ref 12.0–15.0)
Immature Granulocytes: 0 %
Lymphocytes Relative: 39 %
Lymphs Abs: 2.6 10*3/uL (ref 0.7–4.0)
MCH: 29.1 pg (ref 26.0–34.0)
MCHC: 33.2 g/dL (ref 30.0–36.0)
MCV: 87.8 fL (ref 80.0–100.0)
Monocytes Absolute: 0.8 10*3/uL (ref 0.1–1.0)
Monocytes Relative: 12 %
Neutro Abs: 3 10*3/uL (ref 1.7–7.7)
Neutrophils Relative %: 45 %
Platelets: 304 10*3/uL (ref 150–400)
RBC: 4.19 MIL/uL (ref 3.87–5.11)
RDW: 14.3 % (ref 11.5–15.5)
WBC: 6.6 10*3/uL (ref 4.0–10.5)
nRBC: 0 % (ref 0.0–0.2)

## 2021-07-06 LAB — COMPREHENSIVE METABOLIC PANEL
ALT: 21 U/L (ref 0–44)
AST: 21 U/L (ref 15–41)
Albumin: 3.6 g/dL (ref 3.5–5.0)
Alkaline Phosphatase: 85 U/L (ref 38–126)
Anion gap: 8 (ref 5–15)
BUN: 10 mg/dL (ref 6–20)
CO2: 27 mmol/L (ref 22–32)
Calcium: 9.3 mg/dL (ref 8.9–10.3)
Chloride: 102 mmol/L (ref 98–111)
Creatinine, Ser: 0.8 mg/dL (ref 0.44–1.00)
GFR, Estimated: >60 mL/min (ref >60)
Glucose, Bld: 146 mg/dL — ABNORMAL HIGH (ref 70–99)
Potassium: 3.5 mmol/L (ref 3.5–5.1)
Sodium: 137 mmol/L (ref 135–145)
Total Bilirubin: 0.2 mg/dL — ABNORMAL LOW (ref 0.3–1.2)
Total Protein: 7.1 g/dL (ref 6.5–8.1)

## 2021-07-06 LAB — URINALYSIS, ROUTINE W REFLEX MICROSCOPIC
Bilirubin Urine: NEGATIVE
Glucose, UA: NEGATIVE mg/dL
Hgb urine dipstick: NEGATIVE
Ketones, ur: NEGATIVE mg/dL
Leukocytes,Ua: NEGATIVE
Nitrite: NEGATIVE
Protein, ur: NEGATIVE mg/dL
Specific Gravity, Urine: 1.02 (ref 1.005–1.030)
pH: 6 (ref 5.0–8.0)

## 2021-07-06 MED ORDER — LACTATED RINGERS IV BOLUS
1000.0000 mL | Freq: Once | INTRAVENOUS | Status: AC
  Administered 2021-07-06: 1000 mL via INTRAVENOUS

## 2021-07-06 MED ORDER — HYDROCODONE-ACETAMINOPHEN 5-325 MG PO TABS: 1.0000 | ORAL_TABLET | Freq: Four times a day (QID) | ORAL | 0 refills | Status: DC | PRN

## 2021-07-06 MED ORDER — ONDANSETRON HCL 4 MG/2ML IJ SOLN
4.0000 mg | Freq: Once | INTRAMUSCULAR | Status: AC
  Administered 2021-07-06: 4 mg via INTRAVENOUS
  Filled 2021-07-06: qty 2

## 2021-07-06 MED ORDER — MORPHINE SULFATE (PF) 4 MG/ML IV SOLN
4.0000 mg | Freq: Once | INTRAVENOUS | Status: AC
  Administered 2021-07-06: 4 mg via INTRAVENOUS
  Filled 2021-07-06: qty 1

## 2021-07-06 NOTE — Discharge Instructions (Addendum)
Your evaluation did not show any serious cause for your pain.  You have been given a prescription for a small number of a pain medication.  If your pain is getting worse, you may return at any time for further evaluation.  Otherwise, follow-up with your primary care provider.

## 2021-07-06 NOTE — ED Provider Notes (Signed)
[facility] EMERGENCY DEPARTMENT Provider Note   CSN: [id number] Arrival date & time: 07/05/21  2336     History Chief Complaint  Patient presents with   Abdominal Pain    Tracey Cobb is a 50 y.o. female.  The history is provided by the patient.  Abdominal Pain She has history of hypertension, kidney stones, polycystic kidney disease and comes in because of periumbilical pain which started this evening.  Pain is sharp and nonradiating.  She rates pain at 8/10.  He has been associated nausea but no vomiting.  She did have diarrhea starting this morning which seems to have resolved.  Pain is not affected by bowel movement.  Nothing seems to make it better, nothing makes it worse.  It comes and goes without any particular pattern.  She denies fever, chills, sweats.  She states that similar pain in the past was secondary to pancreatitis.   Past Medical History:  Diagnosis Date   Asthma    Depression    Hypertension    Nystagmus    Ovarian cyst    Polycystic kidney disease     There are no problems to display for this patient.   Past Surgical History:  Procedure Laterality Date   CESAREAN SECTION     CHOLECYSTECTOMY       OB History   No obstetric history on file.     Family History  Adopted: Yes    Social History   Tobacco Use   Smoking status: Never   Smokeless tobacco: Never  Vaping Use   Vaping Use: Former  Substance Use Topics   Alcohol use: Yes    Alcohol/week: 14.0 standard drinks    Types: 14 Shots of liquor per week    Comment: On 12/30/20   Drug use: Not Currently    Home Medications Prior to Admission medications   Medication Sig Start Date End Date Taking? Authorizing Provider  albuterol (VENTOLIN HFA) 108 (90 Base) MCG/ACT inhaler Inhale into the lungs. 03/04/20   [provider]  etodolac (LODINE) 300 MG capsule Take 1 capsule (300 mg total) by mouth every 8 (eight) hours. 05/12/21   Linwood Dibbles, MD  FLUoxetine (PROZAC) 20  MG capsule Take 1 capsule (20 mg total) by mouth daily. 01/01/21 01/31/21  Money, Gerlene Burdock, FNP  HYDROcodone-acetaminophen (NORCO/VICODIN) 5-325 MG tablet Take 1 tablet by mouth every 6 (six) hours as needed for severe pain. 06/06/21   Zadie Rhine, MD  ondansetron (ZOFRAN ODT) 8 MG disintegrating tablet Take 1 tablet (8 mg total) by mouth every 8 (eight) hours as needed for nausea or vomiting. 03/12/21   Molpus, John, MD  pantoprazole (PROTONIX) 40 MG tablet Take 1 tablet every morning at least 30 minutes before first dose of Carafate. 03/12/21   Molpus, John, MD  sucralfate (CARAFATE) 1 g tablet Take 1 tablet (1 g total) by mouth 4 (four) times daily -  with meals and at bedtime. 03/12/21   Molpus, John, MD  traZODone (DESYREL) 50 MG tablet Take 1 tablet (50 mg total) by mouth at bedtime. Take 1-2 tablets at bedtime 01/01/21   Money, Gerlene Burdock, FNP    Allergies    Benadryl [diphenhydramine], Ivp dye [iodinated diagnostic agents], Reglan [metoclopramide], and Ciprofloxacin  Review of Systems   Review of Systems  Gastrointestinal:  Positive for abdominal pain.  All other systems reviewed and are negative.  Physical Exam Updated Vital Signs BP (!) 150/94 (BP Location: Left Arm)   Pulse 79  Temp 98.6 F (37 C) (Oral)   Resp 20   Ht 5\' 2"  (1.575 m)   Wt 126.1 kg   LMP 06/21/2021   SpO2 95%   BMI 50.85 kg/m   Physical Exam Vitals and nursing note reviewed.  50 year old female, resting comfortably and in no acute distress. Vital signs are significant for elevated blood pressure. Oxygen saturation is 95%, which is normal. Head is normocephalic and atraumatic. PERRLA, EOMI. Oropharynx is clear. Neck is nontender and supple without adenopathy or JVD. Back is nontender and there is no CVA tenderness. Lungs are clear without rales, wheezes, or rhonchi. Chest is nontender. Heart has regular rate and rhythm without murmur. Abdomen is soft, flat, with moderate periumbilical and epigastric  tenderness.  There is no rebound or guarding.  There are no masses or hepatosplenomegaly and peristalsis is slightly normoactive. Extremities have no cyanosis or edema, full range of motion is present. Skin is warm and dry without rash. Neurologic: Mental status is normal, cranial nerves are intact, there are no motor or sensory deficits.  ED Results / Procedures / Treatments   Labs (all labs ordered are listed, but only abnormal results are displayed) Labs Reviewed  COMPREHENSIVE METABOLIC PANEL - Abnormal; Notable for the following components:      Result Value   Glucose, Bld 146 (*)    Total Bilirubin 0.2 (*)    All other components within normal limits  CBC WITH DIFFERENTIAL/PLATELET  LIPASE, BLOOD  URINALYSIS, ROUTINE W REFLEX MICROSCOPIC    Procedures Procedures   Medications Ordered in ED Medications  lactated ringers bolus 1,000 mL ( Intravenous Stopped 07/06/21 0130)  ondansetron (ZOFRAN) injection 4 mg (4 mg Intravenous Given 07/06/21 0043)  morphine 4 MG/ML injection 4 mg (4 mg Intravenous Given 07/06/21 0044)    ED Course  I have reviewed the triage vital signs and the nursing notes.  Pertinent labs & imaging results that were available during my care of the patient were reviewed by me and considered in my medical decision making (see chart for details).   MDM Rules/Calculators/A&P                         Abdominal pain and diarrhea with relatively benign abdominal exam.  Old records are reviewed, and she has multiple ED visits for abdominal pain in various locations and multiple (10) CT scans of abdomen and pelvis.  Most recent CT of abdomen and pelvis was on 06/09/2021.  CT scans have shown small, nonobstructing bilateral renal calculi and no other pathology.  With benign exam, I do not feel CT of abdomen and pelvis is indicated today.  Will check screening labs, give IV fluids and reassess.  Labs are unremarkable.  Patient is feeling somewhat better following  above-noted treatment.  She is discharged with prescription for small number of hydrocodone-acetaminophen tablets, advised to return if symptoms are worsening.  Follow-up with primary care provider.  Final Clinical Impression(s) / ED Diagnoses Final diagnoses:  Abdominal pain, unspecified abdominal location  Elevated blood pressure reading with diagnosis of hypertension    Rx / DC Orders ED Discharge Orders          Ordered    HYDROcodone-acetaminophen (NORCO/VICODIN) 5-325 MG tablet  Every 6 hours PRN        07/06/21 0151             07/08/21, MD 07/06/21 [phone]

## 2021-09-10 ENCOUNTER — Encounter (HOSPITAL_COMMUNITY): Payer: Self-pay | Admitting: Psychiatry

## 2021-09-10 ENCOUNTER — Other Ambulatory Visit: Payer: Self-pay

## 2021-09-10 ENCOUNTER — Other Ambulatory Visit (HOSPITAL_COMMUNITY)
Admission: EM | Admit: 2021-09-10 | Discharge: 2021-09-12 | Disposition: A | Discharge status: Home / Self Care | Payer: No Payment, Other | Attending: Psychiatry | Admitting: Psychiatry

## 2021-09-10 DIAGNOSIS — Z20822 Contact with and (suspected) exposure to covid-19: Secondary | ICD-10-CM | POA: Diagnosis not present

## 2021-09-10 DIAGNOSIS — F332 Major depressive disorder, recurrent severe without psychotic features: Secondary | ICD-10-CM | POA: Diagnosis not present

## 2021-09-10 DIAGNOSIS — F1090 Alcohol use, unspecified, uncomplicated: Secondary | ICD-10-CM | POA: Diagnosis not present

## 2021-09-10 DIAGNOSIS — Z87891 Personal history of nicotine dependence: Secondary | ICD-10-CM | POA: Insufficient documentation

## 2021-09-10 DIAGNOSIS — Z09 Encounter for follow-up examination after completed treatment for conditions other than malignant neoplasm: Secondary | ICD-10-CM | POA: Diagnosis not present

## 2021-09-10 DIAGNOSIS — Z79899 Other long term (current) drug therapy: Secondary | ICD-10-CM | POA: Insufficient documentation

## 2021-09-10 LAB — LIPID PANEL
Cholesterol: 174 mg/dL (ref 0–200)
HDL: 52 mg/dL (ref >40)
LDL Cholesterol: 89 mg/dL (ref 0–99)
Total CHOL/HDL Ratio: 3.3 RATIO
Triglycerides: 166 mg/dL — ABNORMAL HIGH (ref <150)
VLDL: 33 mg/dL (ref 0–40)

## 2021-09-10 LAB — URINALYSIS, COMPLETE (UACMP) WITH MICROSCOPIC
Bacteria, UA: NONE SEEN
Bilirubin Urine: NEGATIVE
Glucose, UA: NEGATIVE mg/dL
Ketones, ur: NEGATIVE mg/dL
Leukocytes,Ua: NEGATIVE
Nitrite: NEGATIVE
Protein, ur: 30 mg/dL — AB
RBC / HPF: >50 RBC/hpf — ABNORMAL HIGH (ref 0–5)
Specific Gravity, Urine: 1.016 (ref 1.005–1.030)
WBC, UA: >50 WBC/hpf — ABNORMAL HIGH (ref 0–5)
pH: 6 (ref 5.0–8.0)

## 2021-09-10 LAB — CBC WITH DIFFERENTIAL/PLATELET
Abs Immature Granulocytes: 0.02 10*3/uL (ref 0.00–0.07)
Basophils Absolute: 0.1 10*3/uL (ref 0.0–0.1)
Basophils Relative: 1 %
Eosinophils Absolute: 0.2 10*3/uL (ref 0.0–0.5)
Eosinophils Relative: 3 %
HCT: 40.8 % (ref 36.0–46.0)
Hemoglobin: 13 g/dL (ref 12.0–15.0)
Immature Granulocytes: 0 %
Lymphocytes Relative: 28 %
Lymphs Abs: 1.9 10*3/uL (ref 0.7–4.0)
MCH: 28.6 pg (ref 26.0–34.0)
MCHC: 31.9 g/dL (ref 30.0–36.0)
MCV: 89.9 fL (ref 80.0–100.0)
Monocytes Absolute: 0.7 10*3/uL (ref 0.1–1.0)
Monocytes Relative: 10 %
Neutro Abs: 4 10*3/uL (ref 1.7–7.7)
Neutrophils Relative %: 58 %
Platelets: 184 10*3/uL (ref 150–400)
RBC: 4.54 MIL/uL (ref 3.87–5.11)
RDW: 13.9 % (ref 11.5–15.5)
WBC: 6.8 10*3/uL (ref 4.0–10.5)
nRBC: 0 % (ref 0.0–0.2)

## 2021-09-10 LAB — RESP PANEL BY RT-PCR (FLU A&B, COVID) ARPGX2
Influenza A by PCR: NEGATIVE
Influenza B by PCR: NEGATIVE
SARS Coronavirus 2 by RT PCR: NEGATIVE

## 2021-09-10 LAB — HEMOGLOBIN A1C
Hgb A1c MFr Bld: 6.1 % — ABNORMAL HIGH (ref 4.8–5.6)
Mean Plasma Glucose: 128.37 mg/dL

## 2021-09-10 LAB — POCT URINE DRUG SCREEN - MANUAL ENTRY (I-SCREEN)
POC Amphetamine UR: NOT DETECTED
POC Buprenorphine (BUP): NOT DETECTED
POC Cocaine UR: NOT DETECTED
POC Marijuana UR: POSITIVE — AB
POC Methadone UR: NOT DETECTED
POC Methamphetamine UR: NOT DETECTED
POC Morphine: NOT DETECTED
POC Oxazepam (BZO): NOT DETECTED
POC Oxycodone UR: NOT DETECTED
POC Secobarbital (BAR): NOT DETECTED

## 2021-09-10 LAB — COMPREHENSIVE METABOLIC PANEL
ALT: 21 U/L (ref 0–44)
AST: 18 U/L (ref 15–41)
Albumin: 3.5 g/dL (ref 3.5–5.0)
Alkaline Phosphatase: 70 U/L (ref 38–126)
Anion gap: 7 (ref 5–15)
BUN: 6 mg/dL (ref 6–20)
CO2: 26 mmol/L (ref 22–32)
Calcium: 9.3 mg/dL (ref 8.9–10.3)
Chloride: 103 mmol/L (ref 98–111)
Creatinine, Ser: 0.64 mg/dL (ref 0.44–1.00)
GFR, Estimated: >60 mL/min (ref >60)
Glucose, Bld: 117 mg/dL — ABNORMAL HIGH (ref 70–99)
Potassium: 3.7 mmol/L (ref 3.5–5.1)
Sodium: 136 mmol/L (ref 135–145)
Total Bilirubin: 0.2 mg/dL — ABNORMAL LOW (ref 0.3–1.2)
Total Protein: 7.1 g/dL (ref 6.5–8.1)

## 2021-09-10 LAB — ETHANOL: Alcohol, Ethyl (B): <10 mg/dL (ref <10)

## 2021-09-10 LAB — PREGNANCY, URINE: Preg Test, Ur: NEGATIVE

## 2021-09-10 LAB — MAGNESIUM: Magnesium: 1.8 mg/dL (ref 1.7–2.4)

## 2021-09-10 LAB — POC SARS CORONAVIRUS 2 AG: SARSCOV2ONAVIRUS 2 AG: NEGATIVE

## 2021-09-10 LAB — HIV ANTIBODY (ROUTINE TESTING W REFLEX): HIV Screen 4th Generation wRfx: NONREACTIVE

## 2021-09-10 LAB — POCT PREGNANCY, URINE: Preg Test, Ur: NEGATIVE

## 2021-09-10 LAB — POC SARS CORONAVIRUS 2 AG -  ED: SARS Coronavirus 2 Ag: NEGATIVE

## 2021-09-10 LAB — TSH: TSH: 3.605 u[IU]/mL (ref 0.350–4.500)

## 2021-09-10 MED ORDER — TRAZODONE HCL 50 MG PO TABS: 50.0000 mg | ORAL_TABLET | Freq: Every evening | ORAL | Status: DC | PRN

## 2021-09-10 MED ORDER — ALUM & MAG HYDROXIDE-SIMETH 200-200-20 MG/5ML PO SUSP: 30.0000 mL | ORAL | Status: DC | PRN

## 2021-09-10 MED ORDER — FLUOXETINE HCL 10 MG PO CAPS
10.0000 mg | ORAL_CAPSULE | Freq: Every day | ORAL | Status: DC
  Administered 2021-09-10 – 2021-09-12 (×3): 10 mg via ORAL
  Filled 2021-09-10 (×3): qty 1

## 2021-09-10 MED ORDER — ACETAMINOPHEN 325 MG PO TABS: 650.0000 mg | ORAL_TABLET | Freq: Four times a day (QID) | ORAL | Status: DC | PRN

## 2021-09-10 MED ORDER — MAGNESIUM HYDROXIDE 400 MG/5ML PO SUSP: 30.0000 mL | Freq: Every day | ORAL | Status: DC | PRN

## 2021-09-10 NOTE — ED Notes (Signed)
Meal provided 

## 2021-09-10 NOTE — Progress Notes (Signed)
   09/10/21 0936  [facility] Triage Screening (Walk-ins at [facility] only)  How Did You Hear About [place]? Self  What Is the Reason for Your Visit/Call Today? 50 year old Tracey Cobb present to [facility] with complaints of unresolved grief and depression. Report lost her husband January 2022; depressive symptoms increased sleep, decreased appetite and crying episodes. Passive suicidal ideation with no plan, "I just want to join my husband." Hx of suicidal intent 82-years-old via cutting wrist. Denied homicidal ideations, denied auditory/visual hallucinations. Pt lives with mom/dad, works full-time. Denied medication and denied receiving grief counseling.  How Long Has This Been Causing You Problems? <Week  Have You Recently Had Any Thoughts About Hurting Yourself? No  Are You Planning to Commit Suicide/Harm Yourself At This time? No  Have you Recently Had Thoughts About Hurting Someone [name]? No  Are You Planning To Harm Someone At This Time? No  Are you currently experiencing any auditory, visual or other hallucinations? No  Have You Used Any Alcohol or Drugs in the Past 24 Hours? No  Do you have any current medical co-morbidities that require immediate attention? No  Clinician description of patient physical appearance/behavior: Patient present crying and with a sad affect.  What Do You Feel Would Help You the Most Today? Stress Management;Medication(s)  If access to [facility] was not available, would you have sought care in the Emergency Department? Yes  Determination of Need Routine (7 days)  Options For Referral Medication Management;Outpatient Therapy

## 2021-09-10 NOTE — Discharge Instructions (Addendum)

## 2021-09-10 NOTE — ED Notes (Signed)
Patient took her shower. Patient is actively participating in groups.  Patient is cooperative and interacts well with staff. Patient is A&0 x4. Patient denies SI,HI,AVH.  Patient states that she has no complaints at the moment. Patient is in no acute distress. Will continue to monitor for safety.

## 2021-09-10 NOTE — ED Notes (Addendum)
50 yo female walk-in admitted to [facility] unit initially endorsing passive SI in triage d/t husband dying in 2023-01-15 of this year. Currently, pt states, I'm not feeling suicidal, I'm just having a difficult time dealing with my husband passing away in January 15, 2023. He died of a heart attack when I wasn't home. I just can't stop crying and I have anxiety issues". Support and encouragement provided. Pt tearful but cooperative throughout assessment process. Pt denies HI/AVH. Denies substance abuse issues. Pt oriented to room and unit. Pt currently eating meal. No acute distress noted at present. Will monitor for safety.

## 2021-09-10 NOTE — ED Provider Notes (Signed)
Behavioral Health Admission [facility] & OBS)  Date: 09/10/21 Patient Name: Tracey Cobb MRN: [id number] Chief Complaint:  Chief Complaint  Patient presents with   Depression    50 year old Tracey Cobb present to [facility] with complaints of unresolved grief and depression. Report lost her husband January 2022; depressive symptoms increased sleep, decreased appetite and crying episodes. Passive suicidal ideation with no plan, "I just want to join my husband." Hx of suicidal intent 30-years-old via cutting wrist. Denied homicidal ideations, denied auditory/visual hallucinations. Pt lives with mom/dad, works full-time. Denied medication and denied receiving grief counseling.       Diagnoses:  Final diagnoses:  Severe episode of recurrent major depressive disorder, without psychotic features (HCC)    HPI:    Tracey Cobb, 50 yo female patient presented to [facility] as a walk in alone with complaints of "I am still grieving over my husband and my depression and anxiety has gotten worse".  Tracey Cobb, 50 y.o., female patient seen face to face by this provider, consulted with Dr. Lucianne Muss; and chart reviewed on 09/10/21.  On evaluation Tracey Cobb reports she lost her spouse in January 2022.  Since that time her anxiety and depression has progressively worsened.  States it is gotten to the point where she does not know how to deal with it.  She is currently having passive suicidal ideations.  Reports she has a history of 2 inpatient psychiatric admissions in [place].  Last suicide attempt was 5 years ago, states she tried to cut her wrist. She currently does not have an outpatient psychiatric provider.  Denies taking any medications.  Denies any health concerns.  Denies alcohol use.  Endorses marijuana use occasionally.  During evaluation Tracey Cobb is in sitting position in no acute distress.  She is disheveled.  Makes good eye contact.  Speech is clear, coherent, normal rate and time.  She is  tearful during assessment.  She is alert/oriented x 4 and cooperative.  Endorses anxiety and depression.  Endorses feelings of hopelessness and worthlessness.  Has increased crying spells. States she sleeps most of the day, at least 12 hours.  Reports decrease in appetite with possible's 10 pound weight loss.  Her thought process is coherent and relevant; There is no indication that she is currently responding to internal/external stimuli or experiencing delusional thought content.denies auditory and visual hallucinations.  Endorses passive suicidal ideations with a plan to overdose.  States, "I do not think I would do it but I do not know".  Patient cannot contract for safety at this time.  Denies access to firearms/weapons.  Denies homicidal ideations.  Patient answered questions appropriately.    Patient currently lives with her parents.  Is employed full-time in [employer].  She has 2 grown children.  Patient states she was prescribed Prozac at one point but never filled the medication.   PHQ 2-9:  Flowsheet Row ED from 01/01/2021 in [facility]  Thoughts that you would be better off dead, or of hurting yourself in some way Not at all  [facility] 01/01/2021]  PHQ-9 Total Score 24       Flowsheet Row ED from 07/05/2021 in [facility] [facility] from [facility]/06/2021 in [facility] from 05/12/2021 [facility]  C-SSRS RISK CATEGORY No Risk No Risk No Risk        Total Time spent with patient: 30 minutes  Musculoskeletal  Strength & Muscle Tone: within  normal limits Gait & Station: normal Patient leans: N/A  Psychiatric Specialty Exam  Presentation General Appearance: Disheveled  Eye Contact:Good  Speech:Clear and Coherent; Normal Rate  Speech Volume:Normal  Handedness:Right   Mood and Affect  Mood:Anxious; Depressed; Hopeless  Affect:Congruent   Thought  Process  Thought Processes:Coherent  Descriptions of Associations:Intact  Orientation:Full (Time, Place and Person)  Thought Content:Logical    Hallucinations:Hallucinations: None  Ideas of Reference:None  Suicidal Thoughts:Suicidal Thoughts: Yes, Passive SI Passive Intent and/or Plan: Without Intent; Without Plan; With Means to Carry Out  Homicidal Thoughts:Homicidal Thoughts: No   Sensorium  Memory:Immediate Good; Recent Good; Remote Good  Judgment:Fair  Insight:Fair   Executive Functions  Concentration:Good  Attention Span:Good  Recall:Good  Fund of Knowledge:Good  Language:Good   Psychomotor Activity  Psychomotor Activity:Psychomotor Activity: Normal   Assets  Assets:Communication Skills; Desire for Improvement; Financial Resources/Insurance; Housing; Physical Health; Social Support   Sleep  Sleep:Sleep: Poor Number of Hours of Sleep: 12   Nutritional Assessment (For OBS and FBC admissions only) Has the patient had a weight loss or gain of 10 pounds or more in the last 3 months?: Yes Has the patient had a decrease in food intake/or appetite?: Yes Does the patient have dental problems?: No Does the patient have eating habits or behaviors that may be indicators of an eating disorder including binging or inducing vomiting?: No Has the patient recently lost weight without trying?: 1 Has the patient been eating poorly because of a decreased appetite?: 1 Malnutrition Screening Tool Score: 2   Physical Exam Vitals and nursing note reviewed.  Constitutional:      General: She is not in acute distress.    Appearance: Normal appearance. She is not ill-appearing.  HENT:     Head: Normocephalic.  Eyes:     General:        Right eye: No discharge.        Left eye: No discharge.     Conjunctiva/sclera: Conjunctivae normal.  Cardiovascular:     Rate and Rhythm: Normal rate.  Pulmonary:     Effort: Pulmonary effort is normal. No respiratory distress.   Musculoskeletal:        General: Normal range of motion.     Cervical back: Normal range of motion.  Skin:    Coloration: Skin is not jaundiced or pale.  Neurological:     Mental Status: She is alert and oriented to person, place, and time.  Psychiatric:        Attention and Perception: Attention and perception normal.        Mood and Affect: Mood is anxious and depressed. Affect is tearful.        Speech: Speech normal.        Behavior: Behavior is cooperative.        Thought Content: Thought content includes suicidal ideation. Thought content does not include suicidal plan.        Cognition and Memory: Cognition normal.        Judgment: Judgment normal.   Review of Systems  Constitutional: Negative.   HENT: Negative.    Eyes: Negative.   Respiratory: Negative.    Cardiovascular: Negative.   Musculoskeletal: Negative.   Skin: Negative.   Neurological: Negative.   Psychiatric/Behavioral:  Positive for depression and suicidal ideas. The patient is nervous/anxious.    Blood pressure (!) 132/91, pulse 81, temperature (!) 97.4 F (36.3 C), temperature source Oral, resp. rate 18, height 5\' 2"  (1.575 m), SpO2 96 %. Body mass  index is 50.85 kg/m.  Past Psychiatric History: Patient reports MDD   Is the patient at risk to self? Yes  Has the patient been a risk to self in the past 6 months? No .    Has the patient been a risk to self within the distant past? Yes   Is the patient a risk to others? No   Has the patient been a risk to others in the past 6 months? No   Has the patient been a risk to others within the distant past? No   Past Medical History:  Past Medical History:  Diagnosis Date   Asthma    Depression    Hypertension    Nystagmus    Ovarian cyst    Polycystic kidney disease     Past Surgical History:  Procedure Laterality Date   CESAREAN SECTION     CHOLECYSTECTOMY      Family History:  Family History  Adopted: Yes    Social History:  Social History    Socioeconomic History   Marital status: Widowed    Spouse name: Not on file   Number of children: 2   Years of education: Not on file   Highest education level: Some college, no degree  Occupational History   Not on file  Tobacco Use   Smoking status: Never   Smokeless tobacco: Never  Vaping Use   Vaping Use: Former  Substance and Sexual Activity   Alcohol use: Yes    Alcohol/week: 14.0 standard drinks    Types: 14 Shots of liquor per week    Comment: On 12/30/20   Drug use: Not Currently   Sexual activity: Not Currently  Other Topics Concern   Not on file  Social History Narrative   Not on file   Social Determinants of Health   Financial Resource Strain: Not on file  Food Insecurity: Not on file  Transportation Needs: Not on file  Physical Activity: Not on file  Stress: Not on file  Social Connections: Not on file  Intimate Partner Violence: Not on file    SDOH:  SDOH Screenings   Alcohol Screen: Not on file  Depression (PHQ2-9): Medium Risk   PHQ-2 Score: 24  Financial Resource Strain: Not on file  Food Insecurity: Not on file  Housing: Not on file  Physical Activity: Not on file  Social Connections: Not on file  Stress: Not on file  Tobacco Use: Low Risk    Smoking Tobacco Use: Never   Smokeless Tobacco Use: Never  Transportation Needs: Not on file    Last Labs:  Admission on 07/05/2021, Discharged on 07/06/2021  Component Date Value Ref Range Status   Sodium 07/06/2021 137  135 - 145 mmol/L Final   Potassium 07/06/2021 3.5  3.5 - 5.1 mmol/L Final   Chloride 07/06/2021 102  98 - 111 mmol/L Final   CO2 07/06/2021 27  22 - 32 mmol/L Final   Glucose, Bld 07/06/2021 146 (A) 70 - 99 mg/dL Final   Glucose reference range applies only to samples taken after fasting for at least 8 hours.   BUN 07/06/2021 10  6 - 20 mg/dL Final   Creatinine, Ser 07/06/2021 0.80  0.44 - 1.00 mg/dL Final   Calcium 69/62/9528 9.3  8.9 - 10.3 mg/dL Final   Total Protein  07/06/2021 7.1  6.5 - 8.1 g/dL Final   Albumin 41/32/4401 3.6  3.5 - 5.0 g/dL Final   AST 02/72/5366 21  15 - 41 U/L Final  ALT 07/06/2021 21  0 - 44 U/L Final   Alkaline Phosphatase 07/06/2021 85  38 - 126 U/L Final   Total Bilirubin 07/06/2021 0.2 (A) 0.3 - 1.2 mg/dL Final   GFR, Estimated 07/06/2021 >60  >60 mL/min Final   Comment: (NOTE) Calculated using the CKD-EPI Creatinine Equation (2021)    Anion gap 07/06/2021 8  5 - 15 Final   Performed at [facility], [address] [place], [place] [address]   WBC 07/06/2021 6.6  4.0 - 10.5 K/uL Final   RBC 07/06/2021 4.19  3.87 - 5.11 MIL/uL Final   Hemoglobin 07/06/2021 12.2  12.0 - 15.0 g/dL Final   HCT 60/45/4098 36.8  36.0 - 46.0 % Final   MCV 07/06/2021 87.8  80.0 - 100.0 fL Final   MCH 07/06/2021 29.1  26.0 - 34.0 pg Final   MCHC 07/06/2021 33.2  30.0 - 36.0 g/dL Final   RDW 11/91/4782 14.3  11.5 - 15.5 % Final   Platelets 07/06/2021 304  150 - 400 K/uL Final   nRBC 07/06/2021 0.0  0.0 - 0.2 % Final   Neutrophils Relative % 07/06/2021 45  % Final   Neutro Abs 07/06/2021 3.0  1.7 - 7.7 K/uL Final   Lymphocytes Relative 07/06/2021 39  % Final   Lymphs Abs 07/06/2021 2.6  0.7 - 4.0 K/uL Final   Monocytes Relative 07/06/2021 12  % Final   Monocytes Absolute 07/06/2021 0.8  0.1 - 1.0 K/uL Final   Eosinophils Relative 07/06/2021 3  % Final   Eosinophils Absolute 07/06/2021 0.2  0.0 - 0.5 K/uL Final   Basophils Relative 07/06/2021 1  % Final   Basophils Absolute 07/06/2021 0.1  0.0 - 0.1 K/uL Final   Immature Granulocytes 07/06/2021 0  % Final   Abs Immature Granulocytes 07/06/2021 0.01  0.00 - 0.07 K/uL Final   Performed at [facility], [address] [place], [place] [address]   Lipase 07/06/2021 34  11 - 51 U/L Final   Performed at [facility], [address] [place], [place] [address]   Color, Urine 07/06/2021 YELLOW  YELLOW Final   APPearance 07/06/2021 CLEAR  CLEAR Final   Specific  Gravity, Urine 07/06/2021 1.020  1.005 - 1.030 Final   pH 07/06/2021 6.0  5.0 - 8.0 Final   Glucose, UA 07/06/2021 NEGATIVE  NEGATIVE mg/dL Final   Hgb urine dipstick 07/06/2021 NEGATIVE  NEGATIVE Final   Bilirubin Urine 07/06/2021 NEGATIVE  NEGATIVE Final   Ketones, ur 07/06/2021 NEGATIVE  NEGATIVE mg/dL Final   Protein, ur 78/46/9629 NEGATIVE  NEGATIVE mg/dL Final   Nitrite 52/84/1324 NEGATIVE  NEGATIVE Final   Leukocytes,Ua 07/06/2021 NEGATIVE  NEGATIVE Final   Comment: Microscopic not done on urines with negative protein, blood, leukocytes, nitrite, or glucose < 500 mg/dL. Performed at [facility], [address] [place], [place] [address]   Admission on 06/06/2021, Discharged on 06/06/2021  Component Date Value Ref Range Status   Color, Urine 06/06/2021 YELLOW  YELLOW Final   APPearance 06/06/2021 CLEAR  CLEAR Final   Specific Gravity, Urine 06/06/2021 >1.030 (A) 1.005 - 1.030 Final   pH 06/06/2021 5.5  5.0 - 8.0 Final   Glucose, UA 06/06/2021 NEGATIVE  NEGATIVE mg/dL Final   Hgb urine dipstick 06/06/2021 NEGATIVE  NEGATIVE Final   Bilirubin Urine 06/06/2021 NEGATIVE  NEGATIVE Final   Ketones, ur 06/06/2021 NEGATIVE  NEGATIVE mg/dL Final   Protein, ur 72/53/6644 NEGATIVE  NEGATIVE mg/dL Final   Nitrite 74/82/7078 NEGATIVE  NEGATIVE Final   Leukocytes,Ua 06/06/2021 NEGATIVE  NEGATIVE Final   Comment: Microscopic not done on urines with negative protein, blood, leukocytes, nitrite, or glucose < 500 mg/dL. Performed at [facility], [address] [place], [place] [address]    Preg Test, Ur 06/06/2021 NEGATIVE  NEGATIVE Final   Comment:        THE SENSITIVITY OF THIS METHODOLOGY IS >20 mIU/mL. Performed at [facility], [address] [place], [place] [address]    Sodium 06/06/2021 137  135 - 145 mmol/L Final   Potassium 06/06/2021 3.8  3.5 - 5.1 mmol/L Final   Chloride 06/06/2021 103  98 - 111 mmol/L Final   CO2 06/06/2021 27  22 - 32  mmol/L Final   Glucose, Bld 06/06/2021 129 (A) 70 - 99 mg/dL Final   Glucose reference range applies only to samples taken after fasting for at least 8 hours.   BUN 06/06/2021 14  6 - 20 mg/dL Final   Creatinine, Ser 06/06/2021 0.77  0.44 - 1.00 mg/dL Final   Calcium 06/19/1974 9.1  8.9 - 10.3 mg/dL Final   GFR, Estimated 06/06/2021 >60  >60 mL/min Final   Comment: (NOTE) Calculated using the CKD-EPI Creatinine Equation (2021)    Anion gap 06/06/2021 7  5 - 15 Final   Performed at [facility], [address] [place], [place] [address]   WBC 06/06/2021 7.3  4.0 - 10.5 K/uL Final   RBC 06/06/2021 4.19  3.87 - 5.11 MIL/uL Final   Hemoglobin 06/06/2021 12.3  12.0 - 15.0 g/dL Final   HCT 49/82/6415 36.6  36.0 - 46.0 % Final   MCV 06/06/2021 87.4  80.0 - 100.0 fL Final   MCH 06/06/2021 29.4  26.0 - 34.0 pg Final   MCHC 06/06/2021 33.6  30.0 - 36.0 g/dL Final   RDW 83/08/4075 14.4  11.5 - 15.5 % Final   Platelets 06/06/2021 221  150 - 400 K/uL Final   nRBC 06/06/2021 0.0  0.0 - 0.2 % Final   Neutrophils Relative % 06/06/2021 51  % Final   Neutro Abs 06/06/2021 3.7  1.7 - 7.7 K/uL Final   Lymphocytes Relative 06/06/2021 34  % Final   Lymphs Abs 06/06/2021 2.5  0.7 - 4.0 K/uL Final   Monocytes Relative 06/06/2021 11  % Final   Monocytes Absolute 06/06/2021 0.8  0.1 - 1.0 K/uL Final   Eosinophils Relative 06/06/2021 3  % Final   Eosinophils Absolute 06/06/2021 0.2  0.0 - 0.5 K/uL Final   Basophils Relative 06/06/2021 1  % Final   Basophils Absolute 06/06/2021 0.1  0.0 - 0.1 K/uL Final   Immature Granulocytes 06/06/2021 0  % Final   Abs Immature Granulocytes 06/06/2021 0.01  0.00 - 0.07 K/uL Final   Performed at [facility], [address] [place], [place] [address]  Admission on 04/21/2021, Discharged on 04/21/2021  Component Date Value Ref Range Status   SARS Coronavirus 2 04/21/2021 POSITIVE (A) NEGATIVE Final   Comment: (NOTE) SARS-CoV-2 target nucleic acids  are DETECTED.  The SARS-CoV-2 RNA is generally detectable in upper and lower respiratory specimens during the acute phase of infection. Positive results are indicative of the presence of SARS-CoV-2 RNA. Clinical correlation with patient history and other diagnostic information is  necessary to determine patient infection status. Positive results do not rule out bacterial infection or co-infection with other  viruses.  The expected result is Negative.  Fact Sheet for Patients: HairSlick.no  Fact Sheet for Healthcare Providers: quierodirigir.com  This test is not yet approved or cleared by the [place] FDA and  has been authorized for detection and/or diagnosis of SARS-CoV-2 by FDA under an Emergency Use Authorization (EUA). This EUA will remain  in effect (meaning this test can be used) for the duration of the COVID-19 declaration under Section 564(b)(1) of the Act, 21 U.                          S.C. section 360bbb-3(b)(1), unless the authorization is terminated or revoked sooner.   Performed at [facility], [address], [place], [place] [address]   Admission on 03/12/2021, Discharged on 03/12/2021  Component Date Value Ref Range Status   Lipase 03/11/2021 38  11 - 51 U/L Final   Performed at [facility], [address] [place], [place] [address]   Sodium 03/11/2021 137  135 - 145 mmol/L Final   Potassium 03/11/2021 3.4 (A) 3.5 - 5.1 mmol/L Final   Chloride 03/11/2021 103  98 - 111 mmol/L Final   CO2 03/11/2021 26  22 - 32 mmol/L Final   Glucose, Bld 03/11/2021 118 (A) 70 - 99 mg/dL Final   Glucose reference range applies only to samples taken after fasting for at least 8 hours.   BUN 03/11/2021 10  6 - 20 mg/dL Final   Creatinine, Ser 03/11/2021 0.87  0.44 - 1.00 mg/dL Final   Calcium 30/08/2329 9.3  8.9 - 10.3 mg/dL Final   Total Protein 07/62/2633 7.3  6.5 - 8.1 g/dL Final   Albumin  35/45/6256 3.8  3.5 - 5.0 g/dL Final   AST 38/93/7342 22  15 - 41 U/L Final   ALT 03/11/2021 24  0 - 44 U/L Final   Alkaline Phosphatase 03/11/2021 86  38 - 126 U/L Final   Total Bilirubin 03/11/2021 0.4  0.3 - 1.2 mg/dL Final   GFR, Estimated 03/11/2021 >60  >60 mL/min Final   Comment: (NOTE) Calculated using the CKD-EPI Creatinine Equation (2021)    Anion gap 03/11/2021 8  5 - 15 Final   Performed at [facility], [address] [place], [place] [address]   WBC 03/11/2021 5.6  4.0 - 10.5 K/uL Final   Comment: REPEATED TO VERIFY WHITE COUNT CONFIRMED ON SMEAR    RBC 03/11/2021 4.24  3.87 - 5.11 MIL/uL Final   Hemoglobin 03/11/2021 12.1  12.0 - 15.0 g/dL Final   HCT 15/72/6203 36.5  36.0 - 46.0 % Final   MCV 03/11/2021 86.1  80.0 - 100.0 fL Final   MCH 03/11/2021 28.5  26.0 - 34.0 pg Final   MCHC 03/11/2021 33.2  30.0 - 36.0 g/dL Final   RDW 55/97/4163 15.0  11.5 - 15.5 % Final   Platelets 03/11/2021 143 (A) 150 - 400 K/uL Final   Comment: SPECIMEN CHECKED FOR CLOTS REPEATED TO VERIFY PLATELET COUNT CONFIRMED BY SMEAR    nRBC 03/11/2021 0.0  0.0 - 0.2 % Final   Performed at [facility], [address] [place], [place] [address]   Color, Urine 03/11/2021 YELLOW  YELLOW Final   APPearance 03/11/2021 CLOUDY (A) CLEAR Final   Specific Gravity, Urine 03/11/2021 1.020  1.005 - 1.030 Final   pH 03/11/2021 8.0  5.0 - 8.0 Final   Glucose, UA 03/11/2021 NEGATIVE  NEGATIVE mg/dL Final   Hgb urine dipstick 03/11/2021 LARGE (A) NEGATIVE Final   Bilirubin Urine 03/11/2021 NEGATIVE  NEGATIVE Final   Ketones, ur 03/11/2021 NEGATIVE  NEGATIVE mg/dL Final   Protein, ur 16/09/9603 NEGATIVE  NEGATIVE mg/dL Final   Nitrite 54/08/8118 NEGATIVE  NEGATIVE Final   Leukocytes,Ua 03/11/2021 TRACE (A) NEGATIVE Final   Performed at [facility], [address] [place], [place] [address]   Preg Test, Ur 03/11/2021 NEGATIVE  NEGATIVE Final   Comment:        THE  SENSITIVITY OF THIS METHODOLOGY IS >20 mIU/mL. Performed at [facility], [address] [place], [place] [address]    RBC / HPF 03/11/2021 >50  0 - 5 RBC/hpf Final   WBC, UA 03/11/2021 6-10  0 - 5 WBC/hpf Final   Bacteria, UA 03/11/2021 MANY (A) NONE SEEN Final   Squamous Epithelial / LPF 03/11/2021 0-5  0 - 5 Final   Amorphous Crystal 03/11/2021 PRESENT   Final   Performed at [facility], [address] [place], [place] [address]    Allergies: Benadryl [diphenhydramine], Ivp dye [iodinated diagnostic agents], Reglan [metoclopramide], and Ciprofloxacin  PTA Medications: (Not in a hospital admission)   Medical Decision Making  Patient presents with passive suicidal ideations.  Patient cannot contract for safety at this time.  Patient will be admitted to the facility base crisis unit for stabilization and crisis management.    Recommendations  Based on my evaluation the patient does not appear to have an emergency medical condition.  Patient meets requirements for treatment in the [facility].  Patient will be admitted.   Lab work ordered: CBC with differential, CMP, ethanol, A1c, hepatitis panel, HIV, lipid panel, magnesium, urine pregnancy, RPR, TSH, U/A, UDS, chlamydia, POC COVID and PCR COVID.   EKG ordered  Prozac 10 mg QD ordered for depression/anixety   Ardis Hughs, NP 09/10/21  10:49 AM

## 2021-09-10 NOTE — ED Notes (Signed)
Pt oriented to unit given food and fluids. 

## 2021-09-10 NOTE — ED Notes (Signed)
Pt is in the room sleeping. No respiratory distress noted. Will continue to monitor for safety.

## 2021-09-10 NOTE — ED Notes (Signed)
Report given to Nicolette Bang RN at [facility]. Pt escorted by staff to [facility], safety maintained.

## 2021-09-10 NOTE — ED Provider Notes (Signed)
[facility] Admission Suicide Risk Assessment   Nursing information obtained from:   chart  Demographic factors:   low socioeconomic status, caucasian  Current Mental Status:   see subjective data below. Has increased anxiety, depression and passive SI Loss Factors:   lost spouse in January 2022, no outpatient provider, low socioeconomic status. Historical Factors:   history of MDD Risk Reduction Factors:   positive social support from parents, has 2 grown children that are supportive. Has since of obligation to family.  Total Time spent with patient: 30 minutes Principal Problem: <principal problem not specified> Diagnosis:  Active Problems:   MDD (major depressive disorder), recurrent severe, without psychosis (HCC)  Subjective Data: Tracey Cobb, 50 yo female patient presented to [facility] as a walk in alone with complaints of "I am still grieving over my husband and my depression and anxiety has gotten worse".   Tracey Cobb, 50 y.o., female patient seen face to face by this provider, consulted with Dr. Lucianne Muss; and chart reviewed on 09/10/21.  On evaluation [name] reports she lost her spouse in January 2022.  Since that time her anxiety and depression has progressively worsened.  States it is gotten to the point where she does not know how to deal with it.  She is currently having passive suicidal ideations.  Reports she has a history of 2 inpatient psychiatric admissions in [place].  Last suicide attempt was 5 years ago, states she tried to cut her wrist. She currently does not have an outpatient psychiatric provider.  Denies taking any medications.  Denies any health concerns.  Denies alcohol use.  Endorses marijuana use occasionally.   During evaluation Kassie Keng is in sitting position in no acute distress.  She is disheveled.  Makes good eye contact.  Speech is clear, coherent, normal rate and time.  She is tearful during assessment.  She is alert/oriented x 4 and cooperative.  Endorses  anxiety and depression.  Endorses feelings of hopelessness and worthlessness.  Has increased crying spells. States she sleeps most of the day, at least 12 hours.  Reports decrease in appetite with possible's 10 pound weight loss.  Her thought process is coherent and relevant; There is no indication that she is currently responding to internal/external stimuli or experiencing delusional thought content.denies auditory and visual hallucinations.  Endorses passive suicidal ideations with a plan to overdose.  States, "I do not think I would do it but I do not know".  Patient cannot contract for safety at this time.  Denies access to firearms/weapons.  Denies homicidal ideations.  Patient answered questions appropriately.     Patient currently lives with her parents.  Is employed full-time in [employer].  She has 2 grown children.  Patient states she was prescribed Prozac at one point but never filled the medication  Continued Clinical Symptoms:    The "Alcohol Use Disorders Identification Test", Guidelines for Use in Primary Care, Second Edition.  World Science writer [facility]). Score between 0-7:  no or low risk or alcohol related problems. Score between 8-15:  moderate risk of alcohol related problems. Score between 16-19:  high risk of alcohol related problems. Score 20 or above:  warrants further diagnostic evaluation for alcohol dependence and treatment.   CLINICAL FACTORS:   Severe Anxiety and/or Agitation Depression:   Hopelessness   Musculoskeletal: Strength & Muscle Tone: within normal limits Gait & Station: normal Patient leans: N/A  Psychiatric Specialty Exam:  Presentation  General Appearance: Disheveled  Eye Contact:Good  Speech:Clear  and Coherent; Normal Rate  Speech Volume:Normal  Handedness:Right   Mood and Affect  Mood:Anxious; Depressed; Hopeless  Affect:Congruent   Thought Process  Thought Processes:Coherent  Descriptions of  Associations:Intact  Orientation:Full (Time, Place and Person)  Thought Content:Logical  History of Schizophrenia/Schizoaffective disorder:No data recorded Duration of Psychotic Symptoms:No data recorded Hallucinations:Hallucinations: None  Ideas of Reference:None  Suicidal Thoughts:Suicidal Thoughts: Yes, Passive SI Passive Intent and/or Plan: Without Intent; Without Plan; With Means to Carry Out  Homicidal Thoughts:Homicidal Thoughts: No   Sensorium  Memory:Immediate Good; Recent Good; Remote Good  Judgment:Fair  Insight:Fair   Executive Functions  Concentration:Good  Attention Span:Good  Recall:Good  Fund of Knowledge:Good  Language:Good   Psychomotor Activity  Psychomotor Activity:Psychomotor Activity: Normal   Assets  Assets:Communication Skills; Desire for Improvement; Financial Resources/Insurance; Housing; Physical Health; Social Support   Sleep  Sleep:Sleep: Poor Number of Hours of Sleep: 12    Physical Exam: Physical Exam see admission H&P ROSSee admission H&P Blood pressure (!) 132/91, pulse 81, temperature (!) 97.4 F (36.3 C), temperature source Oral, resp. rate 18, height 5\' 2"  (1.575 m), SpO2 96 %. Body mass index is 50.85 kg/m.   COGNITIVE FEATURES THAT CONTRIBUTE TO RISK:  None    SUICIDE RISK:   Mild:  Suicidal ideation of limited frequency, intensity, duration, and specificity.  There are no identifiable plans, no associated intent, mild dysphoria and related symptoms, good self-control (both objective and subjective assessment), few other risk factors, and identifiable protective factors, including available and accessible social support.  PLAN OF CARE:   Patient meets requirements for treatment in the [facility].  Patient will be admitted.    Lab work ordered: CBC with differential, CMP, ethanol, A1c, hepatitis panel, HIV, lipid panel, magnesium, urine pregnancy, RPR, TSH, U/A, UDS, chlamydia, POC COVID and PCR COVID.    EKG  ordered   Prozac 10 mg QD ordered for depression/anixety    I certify that inpatient services furnished can reasonably be expected to improve the patient's condition.   [facility], NP 09/10/2021, 11:14 AM

## 2021-09-11 DIAGNOSIS — F1090 Alcohol use, unspecified, uncomplicated: Secondary | ICD-10-CM | POA: Diagnosis not present

## 2021-09-11 DIAGNOSIS — F332 Major depressive disorder, recurrent severe without psychotic features: Secondary | ICD-10-CM | POA: Diagnosis not present

## 2021-09-11 DIAGNOSIS — Z09 Encounter for follow-up examination after completed treatment for conditions other than malignant neoplasm: Secondary | ICD-10-CM | POA: Diagnosis not present

## 2021-09-11 DIAGNOSIS — Z20822 Contact with and (suspected) exposure to covid-19: Secondary | ICD-10-CM | POA: Diagnosis not present

## 2021-09-11 LAB — GC/CHLAMYDIA PROBE AMP (~~LOC~~) NOT AT ARMC
Chlamydia: NEGATIVE
Comment: NEGATIVE
Comment: NORMAL
Neisseria Gonorrhea: NEGATIVE

## 2021-09-11 LAB — RPR: RPR Ser Ql: NONREACTIVE

## 2021-09-11 MED ORDER — BUSPIRONE HCL 15 MG PO TABS
7.5000 mg | ORAL_TABLET | Freq: Two times a day (BID) | ORAL | Status: DC
  Administered 2021-09-11 – 2021-09-12 (×3): 7.5 mg via ORAL
  Filled 2021-09-11 (×3): qty 1

## 2021-09-11 NOTE — Progress Notes (Signed)
Patient sitting in dayroom watching television.  Stated unsure if Buspar is helping, although she does feel sleepy.  Ate lunch.  Will continue to monitor for safety.

## 2021-09-11 NOTE — Clinical Social Work Psych Note (Signed)
Trauma & Grief  Trauma & Grief (Psychoeducational/Processing Group)  Date: 09/11/21  Type of Therapy/Therapeutic Modalities: Group Discussion, Psycho-Education  Participation Level: Present  Objective: This group will allow the opportunity for patients to increase awareness about trauma and grief, and how it could affect an individual's developmental process, emotional intelligence and overall quality of life. Facilitators will guide discussion around the premises that education about trauma and grief could assist in the prevention and treatment of various mental illnesses.   Therapeutic Goals:  1. Patient will learn the definitions and various forms of trauma.  2.Patient will learn the definition and the process of grief.  3. Patient will learn how trauma and grief effects and individual's mental health and overall wellbeing 4. Patient will be encouraged to discuss their personal goals regarding managing any personal experiences with trauma and grief.   Summary of Patient's Progress:  Tracey Cobb was engaged and participated throughout the processing portion of this encounter. Tracey Cobb continues to experience complicated grief over her deceased partner. Tracey Cobb completed worksheet challenging her to address the five different stages of grief in her perspective/experience.

## 2021-09-11 NOTE — ED Notes (Signed)
Pt came out of room and stood in the hallway looking around. Staff asked pt what was wrong. Pt states, "I think I'm having an anxiety attack." Staff encouraged pt to come sit on couch by nurse's station so she didn't have to be alone. Pt is tearful and states, "I miss my husband." Apple juice provided per pt request. Emotional support and encouragement provided. No signs of acute distress noted. Will continue to monitor for safety.

## 2021-09-11 NOTE — Clinical Social Work Psych Note (Signed)
CSW Note   CSW met with patient for introduction and to begin discussions regarding potential discharge planning.   Tracey Cobb shared that she has struggled with increasing anxiety and depression since the passing of her partner earlier this year in January. Tracey Cobb reports she found her partner after he passed away from a heart attack. Tracey Cobb expressed that she feels "survivor's remorse".   Tracey Cobb reports she continues to experience "flashbacks" of the day she found her partner.   Tracey Cobb shared that she came to the [facility] back in January following her partner's passing, and was prescribed Prozac, however she did not get her prescription filled. Tracey Cobb lives with her parents and works full time.   Tracey Cobb was agreeable with following up with [facility] for continuity of care.   Tracey Cobb plans to return home with her parents at discharge.  CSW will continue to follow     Radonna Ricker, MSW, LCSW Clinical Social Worker ([place]) [facility]

## 2021-09-11 NOTE — Progress Notes (Signed)
Patient up to nurse's station visiting with staff.  Thought her Buspar was TID instead of BID.  Patient discussing her parents.  Stated that she was put up for adoption at age 50 and reconnected with her biological mother through [employer].  Stated she lives with her biological parents, although her adopted parents also live in [place].  Stated she has a love/hate relationship with her adopted mom.  Reported feeling better and stated that she may go home tomorrow.  "I'll see how I feel."  Will continue to monitor for safety.

## 2021-09-11 NOTE — ED Notes (Addendum)
Pt sitting on couch by nurse's station talking on phone. Pt A&O x4, calm and cooperative. Pt denies current SI/HI/AVH. Pt denies any immediate needs. No signs of acute distress noted. Will continue to monitor for safety.

## 2021-09-11 NOTE — Progress Notes (Signed)
Patient in dayroom with book.  Spoke with Eber Jones, NP.  Stated that she's going to start Buspar.  Denied SI, HI, AVH.

## 2021-09-11 NOTE — ED Notes (Signed)
Pt is in bed sleeping. No respiratory distress noted. Will continue to monitor for safety. 

## 2021-09-11 NOTE — Group Note (Signed)
Group Topic: Wellness  Group Date: 09/11/2021 Start Time: 1000 End Time: 1045 Facilitators: Alexandria Lodge D, NT  Department: [facility]  Number of Participants: 1  Group Focus: acceptance, affirmation, communication, coping skills, family, feeling awareness/expression, goals/reality orientation, loss/grief issues, safety plan, and self-awareness Treatment Modality:  Individual Therapy Interventions utilized were clarification, group exercise, patient education, problem solving, reminiscence, story telling, and support Purpose: enhance coping skills, express feelings, express irrational fears, improve communication skills, increase insight, and reinforce self-care  Name: Tracey Cobb Date of Birth: December 15, 1970  MR: [id number]    Level of Participation: active Quality of Participation: attentive, cooperative, engaged, motivated, and supportive Interactions with others: gave feedback  Mood/Affect: anxious Triggers (if applicable): Christmas is coming up and that was her husband favorite holiday so she is anxious about this, due to his passing earlier this year.  Cognition: concrete, goal directed, and insightful Progress: Moderate Response: She was very engaged and felt up lifted.  Plan: patient will be encouraged to go home and start to journal each day, create goals, and find positive coping skills.  Patients Problems:  Patient Active Problem List   Diagnosis Date Noted   MDD (major depressive disorder), recurrent severe, without psychosis (HCC) 09/10/2021

## 2021-09-11 NOTE — Progress Notes (Signed)
Patient sitting in dayroom watching television and coloring.  Stated feeling more relaxed after taking Buspar.  Vital signs rechecked.

## 2021-09-11 NOTE — Progress Notes (Signed)
Patient medication compliant.

## 2021-09-11 NOTE — ED Notes (Signed)
Patient is in room sleeping. No respiratory distress noted. Will continue to monitor for safety.

## 2021-09-11 NOTE — ED Notes (Signed)
Pt asleep in bed. Respirations even and unlabored. Will continue to monitor for safety. ?

## 2021-09-11 NOTE — ED Provider Notes (Signed)
Behavioral Health Progress Note  Date and Time: 09/11/2021 7:58 AM Name: Tracey Cobb MRN:  [id number]  Subjective:  Tracey Cobb, 50 y.o., female patient seen face to face by this provider, chart reviewed and discussed with treatment team and Dr. Lucianne Muss on 09/11/21.  On evaluation Tracey Cobb is sitting in the day room eating her breakfast. She is disheveled and makes fair eye contact. Patient is cooperative and alert/oriented x 4. She is crying. States, " I just want want to be with my husband, I miss him so much".She denies being suicidal at this time.  States she has survivors quilt. She found husband on the floor after he had a massive heart attack, he had already passed when she found him.  Denies AVH and HI.  She does not appear to be responding to internal/external stimuli.States her anxiety has increased.  Reports this morning she thinks she had a panic attack.  Per nursing note patient came out of her room and was confused and told nursing staff she was having a panic.  Discussed starting BuSpar for anxiety with patient patient agreed.  She is tolerating the Prozac but no adverse reactions.  States she attended one group session yesterday. Denies any health concerns at this time. Patient's feelings and progress discussed.  Reassurance, support, and encouragement provided.   Diagnosis:  Final diagnoses:  Severe episode of recurrent major depressive disorder, without psychotic features (HCC)    Total Time spent with patient: 30 minutes  Past Psychiatric History: Per patient: MDD Past Medical History:  Past Medical History:  Diagnosis Date   Asthma    Depression    Hypertension    Nystagmus    Ovarian cyst    Polycystic kidney disease     Past Surgical History:  Procedure Laterality Date   CESAREAN SECTION     CHOLECYSTECTOMY     Family History:  Family History  Adopted: Yes   Family Psychiatric  History: unknown Social History:  Social History   Substance and Sexual  Activity  Alcohol Use Yes   Alcohol/week: 14.0 standard drinks   Types: 14 Shots of liquor per week   Comment: On 12/30/20     Social History   Substance and Sexual Activity  Drug Use Not Currently    Social History   Socioeconomic History   Marital status: Widowed    Spouse name: Not on file   Number of children: 2   Years of education: Not on file   Highest education level: Some college, no degree  Occupational History   Not on file  Tobacco Use   Smoking status: Never   Smokeless tobacco: Never  Vaping Use   Vaping Use: Former  Substance and Sexual Activity   Alcohol use: Yes    Alcohol/week: 14.0 standard drinks    Types: 14 Shots of liquor per week    Comment: On 12/30/20   Drug use: Not Currently   Sexual activity: Not Currently  Other Topics Concern   Not on file  Social History Narrative   Not on file   Social Determinants of Health   Financial Resource Strain: Not on file  Food Insecurity: Not on file  Transportation Needs: Not on file  Physical Activity: Not on file  Stress: Not on file  Social Connections: Not on file   SDOH:  SDOH Screenings   Alcohol Screen: Not on file  Depression (PHQ2-9): Medium Risk   PHQ-2 Score: 24  Financial Resource Strain: Not on file  Food  Insecurity: Not on file  Housing: Not on file  Physical Activity: Not on file  Social Connections: Not on file  Stress: Not on file  Tobacco Use: Low Risk    Smoking Tobacco Use: Never   Smokeless Tobacco Use: Never  Transportation Needs: Not on file   Additional Social History:      Sleep: Good  Appetite:  Good  Current Medications:  Current Facility-Administered Medications  Medication Dose Route Frequency Provider Last Rate Last Admin   acetaminophen (TYLENOL) tablet 650 mg  650 mg Oral Q6H PRN Ardis Hughs, NP       alum & mag hydroxide-simeth (MAALOX/MYLANTA) 200-200-20 MG/5ML suspension 30 mL  30 mL Oral Q4H PRN Ardis Hughs, NP       busPIRone  (BUSPAR) tablet 7.5 mg  7.5 mg Oral BID Ardis Hughs, NP       FLUoxetine (PROZAC) capsule 10 mg  10 mg Oral Daily Ardis Hughs, NP   10 mg at 09/10/21 1218   magnesium hydroxide (MILK OF MAGNESIA) suspension 30 mL  30 mL Oral Daily PRN Ardis Hughs, NP       traZODone (DESYREL) tablet 50 mg  50 mg Oral QHS PRN Ardis Hughs, NP       Current Outpatient Medications  Medication Sig Dispense Refill   albuterol (VENTOLIN HFA) 108 (90 Base) MCG/ACT inhaler Inhale 2 puffs into the lungs every 4 (four) hours as needed for wheezing or shortness of breath.      Labs  Lab Results:  Admission on 09/10/2021  Component Date Value Ref Range Status   SARS Coronavirus 2 by RT PCR 09/10/2021 NEGATIVE  NEGATIVE Final   Comment: (NOTE) SARS-CoV-2 target nucleic acids are NOT DETECTED.  The SARS-CoV-2 RNA is generally detectable in upper respiratory specimens during the acute phase of infection. The lowest concentration of SARS-CoV-2 viral copies this assay can detect is 138 copies/mL. A negative result does not preclude SARS-Cov-2 infection and should not be used as the sole basis for treatment or other patient management decisions. A negative result may occur with  improper specimen collection/handling, submission of specimen other than nasopharyngeal swab, presence of viral mutation(s) within the areas targeted by this assay, and inadequate number of viral copies(<138 copies/mL). A negative result must be combined with clinical observations, patient history, and epidemiological information. The expected result is Negative.  Fact Sheet for Patients:  BloggerCourse.com  Fact Sheet for Healthcare Providers:  SeriousBroker.it  This test is no                          t yet approved or cleared by the [place] FDA and  has been authorized for detection and/or diagnosis of SARS-CoV-2 by FDA under an Emergency Use  Authorization (EUA). This EUA will remain  in effect (meaning this test can be used) for the duration of the COVID-19 declaration under Section 564(b)(1) of the Act, 21 U.S.C.section 360bbb-3(b)(1), unless the authorization is terminated  or revoked sooner.       Influenza A by PCR 09/10/2021 NEGATIVE  NEGATIVE Final   Influenza B by PCR 09/10/2021 NEGATIVE  NEGATIVE Final   Comment: (NOTE) The Xpert Xpress SARS-CoV-2/FLU/RSV plus assay is intended as an aid in the diagnosis of influenza from Nasopharyngeal swab specimens and should not be used as a sole basis for treatment. Nasal washings and aspirates are unacceptable for Xpert Xpress SARS-CoV-2/FLU/RSV testing.  Fact Sheet for Patients:  BloggerCourse.com  Fact Sheet for Healthcare Providers: SeriousBroker.it  This test is not yet approved or cleared by the [place] FDA and has been authorized for detection and/or diagnosis of SARS-CoV-2 by FDA under an Emergency Use Authorization (EUA). This EUA will remain in effect (meaning this test can be used) for the duration of the COVID-19 declaration under Section 564(b)(1) of the Act, 21 U.S.C. section 360bbb-3(b)(1), unless the authorization is terminated or revoked.  Performed at [facility], [address] [place], [place] [address]    WBC 09/10/2021 6.8  4.0 - 10.5 K/uL Final   RBC 09/10/2021 4.54  3.87 - 5.11 MIL/uL Final   Hemoglobin 09/10/2021 13.0  12.0 - 15.0 g/dL Final   HCT 29/56/2130 40.8  36.0 - 46.0 % Final   MCV 09/10/2021 89.9  80.0 - 100.0 fL Final   MCH 09/10/2021 28.6  26.0 - 34.0 pg Final   MCHC 09/10/2021 31.9  30.0 - 36.0 g/dL Final   RDW 86/57/8469 13.9  11.5 - 15.5 % Final   Platelets 09/10/2021 184  150 - 400 K/uL Final   nRBC 09/10/2021 0.0  0.0 - 0.2 % Final   Neutrophils Relative % 09/10/2021 58  % Final   Neutro Abs 09/10/2021 4.0  1.7 - 7.7 K/uL Final   Lymphocytes Relative 09/10/2021  28  % Final   Lymphs Abs 09/10/2021 1.9  0.7 - 4.0 K/uL Final   Monocytes Relative 09/10/2021 10  % Final   Monocytes Absolute 09/10/2021 0.7  0.1 - 1.0 K/uL Final   Eosinophils Relative 09/10/2021 3  % Final   Eosinophils Absolute 09/10/2021 0.2  0.0 - 0.5 K/uL Final   Basophils Relative 09/10/2021 1  % Final   Basophils Absolute 09/10/2021 0.1  0.0 - 0.1 K/uL Final   Immature Granulocytes 09/10/2021 0  % Final   Abs Immature Granulocytes 09/10/2021 0.02  0.00 - 0.07 K/uL Final   Performed at [facility], [address], [place], [place] [address]   Sodium 09/10/2021 136  135 - 145 mmol/L Final   Potassium 09/10/2021 3.7  3.5 - 5.1 mmol/L Final   Chloride 09/10/2021 103  98 - 111 mmol/L Final   CO2 09/10/2021 26  22 - 32 mmol/L Final   Glucose, Bld 09/10/2021 117 (A) 70 - 99 mg/dL Final   Glucose reference range applies only to samples taken after fasting for at least 8 hours.   BUN 09/10/2021 6  6 - 20 mg/dL Final   Creatinine, Ser 09/10/2021 0.64  0.44 - 1.00 mg/dL Final   Calcium 84/13/2440 9.3  8.9 - 10.3 mg/dL Final   Total Protein 10/05/2535 7.1  6.5 - 8.1 g/dL Final   Albumin 64/40/3474 3.5  3.5 - 5.0 g/dL Final   AST 25/95/6387 18  15 - 41 U/L Final   ALT 09/10/2021 21  0 - 44 U/L Final   Alkaline Phosphatase 09/10/2021 70  38 - 126 U/L Final   Total Bilirubin 09/10/2021 0.2 (A) 0.3 - 1.2 mg/dL Final   GFR, Estimated 09/10/2021 >60  >60 mL/min Final   Comment: (NOTE) Calculated using the CKD-EPI Creatinine Equation (2021)    Anion gap 09/10/2021 7  5 - 15 Final   Performed at [facility], [address], [place], [place] [address]   Hgb A1c MFr Bld 09/10/2021 6.1 (A) 4.8 - 5.6 % Final   Comment: (NOTE) Pre diabetes:          5.7%-6.4%  Diabetes:              >6.4%  Glycemic control for   <7.0% adults with diabetes    Mean Plasma Glucose 09/10/2021 128.37  mg/dL Final   Performed at [facility], [address] [place], [place] [address]    Magnesium 09/10/2021 1.8  1.7 - 2.4 mg/dL Final   Performed at [facility], [address], [place], [place] [address]   Alcohol, Ethyl (B) 09/10/2021 <10  <10 mg/dL Final   Comment: (NOTE) Lowest detectable limit for serum alcohol is 10 mg/dL.  For medical purposes only. Performed at [facility], [address], [place], [place] [address]    Color, Urine 09/10/2021 YELLOW  YELLOW Final   APPearance 09/10/2021 HAZY (A) CLEAR Final   Specific Gravity, Urine 09/10/2021 1.016  1.005 - 1.030 Final   pH 09/10/2021 6.0  5.0 - 8.0 Final   Glucose, UA 09/10/2021 NEGATIVE  NEGATIVE mg/dL Final   Hgb urine dipstick 09/10/2021 LARGE (A) NEGATIVE Final   Bilirubin Urine 09/10/2021 NEGATIVE  NEGATIVE Final   Ketones, ur 09/10/2021 NEGATIVE  NEGATIVE mg/dL Final   Protein, ur 92/42/6834 30 (A) NEGATIVE mg/dL Final   Nitrite 19/62/2297 NEGATIVE  NEGATIVE Final   Leukocytes,Ua 09/10/2021 NEGATIVE  NEGATIVE Final   RBC / HPF 09/10/2021 >50 (A) 0 - 5 RBC/hpf Final   WBC, UA 09/10/2021 >50 (A) 0 - 5 WBC/hpf Final   Bacteria, UA 09/10/2021 NONE SEEN  NONE SEEN Final   Squamous Epithelial / LPF 09/10/2021 0-5  0 - 5 Final   Mucus 09/10/2021 PRESENT   Final   Performed at [facility], [address], [place], [place] [address]   Preg Test, Ur 09/10/2021 NEGATIVE  NEGATIVE Final   Comment:        THE SENSITIVITY OF THIS METHODOLOGY IS >20 mIU/mL. Performed at [facility], [address] [place], [place] [address]    POC Amphetamine UR 09/10/2021 None Detected  NONE DETECTED (Cut Off Level 1000 ng/mL) Final   POC Secobarbital (BAR) 09/10/2021 None Detected  NONE DETECTED (Cut Off Level 300 ng/mL) Final   POC Buprenorphine (BUP) 09/10/2021 None Detected  NONE DETECTED (Cut Off Level 10 ng/mL) Final   POC Oxazepam (BZO) 09/10/2021 None Detected  NONE DETECTED (Cut Off Level 300 ng/mL) Final   POC Cocaine UR 09/10/2021 None Detected  NONE DETECTED (Cut Off Level 300 ng/mL)  Final   POC Methamphetamine UR 09/10/2021 None Detected  NONE DETECTED (Cut Off Level 1000 ng/mL) Final   POC Morphine 09/10/2021 None Detected  NONE DETECTED (Cut Off Level 300 ng/mL) Final   POC Oxycodone UR 09/10/2021 None Detected  NONE DETECTED (Cut Off Level 100 ng/mL) Final   POC Methadone UR 09/10/2021 None Detected  NONE DETECTED (Cut Off Level 300 ng/mL) Final   POC Marijuana UR 09/10/2021 Positive (A) NONE DETECTED (Cut Off Level 50 ng/mL) Final   SARS Coronavirus 2 Ag 09/10/2021 Negative  Negative Final   HIV Screen 4th Generation wRfx 09/10/2021 Non Reactive  Non Reactive Final   Performed at [facility], [address], [place], [place] [address]   SARSCOV2ONAVIRUS 2 AG 09/10/2021 NEGATIVE  NEGATIVE Final   Comment: (NOTE) SARS-CoV-2 antigen NOT DETECTED.   Negative results are presumptive.  Negative results do not preclude SARS-CoV-2 infection and should not be used as the sole basis for treatment or other patient management decisions, including infection  control  decisions, particularly in the presence of clinical signs and  symptoms consistent with COVID-19, or in those who have been in contact with the virus.  Negative results must be combined with clinical observations, patient history, and epidemiological information. The expected result is Negative.  Fact Sheet for Patients: https://www.jennings-kim.com/  Fact Sheet for Healthcare Providers: https://alexander-rogers.biz/  This test is not yet approved or cleared by the [place] FDA and  has been authorized for detection and/or diagnosis of SARS-CoV-2 by FDA under an Emergency Use Authorization (EUA).  This EUA will remain in effect (meaning this test can be used) for the duration of  the COV                          ID-19 declaration under Section 564(b)(1) of the Act, 21 U.S.C. section 360bbb-3(b)(1), unless the authorization is terminated or revoked sooner.     Preg Test,  Ur 09/10/2021 NEGATIVE  NEGATIVE Final   Comment:        THE SENSITIVITY OF THIS METHODOLOGY IS >24 mIU/mL    Cholesterol 09/10/2021 174  0 - 200 mg/dL Final   Triglycerides 26/94/8546 166 (A) <150 mg/dL Final   HDL 27/02/5008 52  >40 mg/dL Final   Total CHOL/HDL Ratio 09/10/2021 3.3  RATIO Final   VLDL 09/10/2021 33  0 - 40 mg/dL Final   LDL Cholesterol 09/10/2021 89  0 - 99 mg/dL Final   Comment:        Total Cholesterol/HDL:CHD Risk Coronary Heart Disease Risk Table                     Men   Women  1/2 Average Risk   3.4   3.3  Average Risk       5.0   4.4  2 X Average Risk   9.6   7.1  3 X Average Risk  23.4   11.0        Use the calculated Patient Ratio above and the CHD Risk Table to determine the patient's CHD Risk.        ATP III CLASSIFICATION (LDL):  <100     mg/dL   Optimal  381-829  mg/dL   Near or Above                    Optimal  130-159  mg/dL   Borderline  937-169  mg/dL   High  >678     mg/dL   Very High Performed at [facility], [address] [place], [place] [address]    TSH 09/10/2021 3.605  0.350 - 4.500 uIU/mL Final   Comment: Performed by a 3rd Generation assay with a functional sensitivity of <=0.01 uIU/mL. Performed at [facility], [address], [place], [place] [address]   Admission on 07/05/2021, Discharged on 07/06/2021  Component Date Value Ref Range Status   Sodium 07/06/2021 137  135 - 145 mmol/L Final   Potassium 07/06/2021 3.5  3.5 - 5.1 mmol/L Final   Chloride 07/06/2021 102  98 - 111 mmol/L Final   CO2 07/06/2021 27  22 - 32 mmol/L Final   Glucose, Bld 07/06/2021 146 (A) 70 - 99 mg/dL Final   Glucose reference range applies only to samples taken after fasting for at least 8 hours.   BUN 07/06/2021 10  6 - 20 mg/dL Final   Creatinine, Ser 07/06/2021 0.80  0.44 - 1.00 mg/dL Final  Calcium 07/06/2021 9.3  8.9 - 10.3 mg/dL Final   Total Protein 65/78/4696 7.1  6.5 - 8.1 g/dL Final   Albumin 29/52/8413 3.6  3.5 - 5.0  g/dL Final   AST 24/40/1027 21  15 - 41 U/L Final   ALT 07/06/2021 21  0 - 44 U/L Final   Alkaline Phosphatase 07/06/2021 85  38 - 126 U/L Final   Total Bilirubin 07/06/2021 0.2 (A) 0.3 - 1.2 mg/dL Final   GFR, Estimated 07/06/2021 >60  >60 mL/min Final   Comment: (NOTE) Calculated using the CKD-EPI Creatinine Equation (2021)    Anion gap 07/06/2021 8  5 - 15 Final   Performed at [facility], [address] [place], [place] [address]   WBC 07/06/2021 6.6  4.0 - 10.5 K/uL Final   RBC 07/06/2021 4.19  3.87 - 5.11 MIL/uL Final   Hemoglobin 07/06/2021 12.2  12.0 - 15.0 g/dL Final   HCT 44/02/4741 36.8  36.0 - 46.0 % Final   MCV 07/06/2021 87.8  80.0 - 100.0 fL Final   MCH 07/06/2021 29.1  26.0 - 34.0 pg Final   MCHC 07/06/2021 33.2  30.0 - 36.0 g/dL Final   RDW 59/56/3875 14.3  11.5 - 15.5 % Final   Platelets 07/06/2021 304  150 - 400 K/uL Final   nRBC 07/06/2021 0.0  0.0 - 0.2 % Final   Neutrophils Relative % 07/06/2021 45  % Final   Neutro Abs 07/06/2021 3.0  1.7 - 7.7 K/uL Final   Lymphocytes Relative 07/06/2021 39  % Final   Lymphs Abs 07/06/2021 2.6  0.7 - 4.0 K/uL Final   Monocytes Relative 07/06/2021 12  % Final   Monocytes Absolute 07/06/2021 0.8  0.1 - 1.0 K/uL Final   Eosinophils Relative 07/06/2021 3  % Final   Eosinophils Absolute 07/06/2021 0.2  0.0 - 0.5 K/uL Final   Basophils Relative 07/06/2021 1  % Final   Basophils Absolute 07/06/2021 0.1  0.0 - 0.1 K/uL Final   Immature Granulocytes 07/06/2021 0  % Final   Abs Immature Granulocytes 07/06/2021 0.01  0.00 - 0.07 K/uL Final   Performed at [facility], [address] [place], [place] [address]   Lipase 07/06/2021 34  11 - 51 U/L Final   Performed at [facility], [address] [place], [place] [address]   Color, Urine 07/06/2021 YELLOW  YELLOW Final   APPearance 07/06/2021 CLEAR  CLEAR Final   Specific Gravity, Urine 07/06/2021 1.020  1.005 - 1.030 Final   pH 07/06/2021 6.0  5.0  - 8.0 Final   Glucose, UA 07/06/2021 NEGATIVE  NEGATIVE mg/dL Final   Hgb urine dipstick 07/06/2021 NEGATIVE  NEGATIVE Final   Bilirubin Urine 07/06/2021 NEGATIVE  NEGATIVE Final   Ketones, ur 07/06/2021 NEGATIVE  NEGATIVE mg/dL Final   Protein, ur 41/66/0630 NEGATIVE  NEGATIVE mg/dL Final   Nitrite 16/12/930 NEGATIVE  NEGATIVE Final   Leukocytes,Ua 07/06/2021 NEGATIVE  NEGATIVE Final   Comment: Microscopic not done on urines with negative protein, blood, leukocytes, nitrite, or glucose < 500 mg/dL. Performed at [facility], [address] [place], [place] [address]   Admission on 06/06/2021, Discharged on 06/06/2021  Component Date Value Ref Range Status   Color, Urine 06/06/2021 YELLOW  YELLOW Final   APPearance 06/06/2021 CLEAR  CLEAR Final   Specific Gravity, Urine 06/06/2021 >1.030 (A) 1.005 - 1.030 Final   pH 06/06/2021 5.5  5.0 - 8.0  Final   Glucose, UA 06/06/2021 NEGATIVE  NEGATIVE mg/dL Final   Hgb urine dipstick 06/06/2021 NEGATIVE  NEGATIVE Final   Bilirubin Urine 06/06/2021 NEGATIVE  NEGATIVE Final   Ketones, ur 06/06/2021 NEGATIVE  NEGATIVE mg/dL Final   Protein, ur 96/03/5408 NEGATIVE  NEGATIVE mg/dL Final   Nitrite 81/19/1478 NEGATIVE  NEGATIVE Final   Leukocytes,Ua 06/06/2021 NEGATIVE  NEGATIVE Final   Comment: Microscopic not done on urines with negative protein, blood, leukocytes, nitrite, or glucose < 500 mg/dL. Performed at [facility], [address] [place], [place] [address]    Preg Test, Ur 06/06/2021 NEGATIVE  NEGATIVE Final   Comment:        THE SENSITIVITY OF THIS METHODOLOGY IS >20 mIU/mL. Performed at [facility], [address] [place], [place] [address]    Sodium 06/06/2021 137  135 - 145 mmol/L Final   Potassium 06/06/2021 3.8  3.5 - 5.1 mmol/L Final   Chloride 06/06/2021 103  98 - 111 mmol/L Final   CO2 06/06/2021 27  22 - 32 mmol/L Final   Glucose, Bld 06/06/2021 129 (A) 70 - 99 mg/dL Final   Glucose  reference range applies only to samples taken after fasting for at least 8 hours.   BUN 06/06/2021 14  6 - 20 mg/dL Final   Creatinine, Ser 06/06/2021 0.77  0.44 - 1.00 mg/dL Final   Calcium 57/84/6962 9.1  8.9 - 10.3 mg/dL Final   GFR, Estimated 06/06/2021 >60  >60 mL/min Final   Comment: (NOTE) Calculated using the CKD-EPI Creatinine Equation (2021)    Anion gap 06/06/2021 7  5 - 15 Final   Performed at [facility], [address] [place], [place] [address]   WBC 06/06/2021 7.3  4.0 - 10.5 K/uL Final   RBC 06/06/2021 4.19  3.87 - 5.11 MIL/uL Final   Hemoglobin 06/06/2021 12.3  12.0 - 15.0 g/dL Final   HCT 13/24/4010 36.6  36.0 - 46.0 % Final   MCV 06/06/2021 87.4  80.0 - 100.0 fL Final   MCH 06/06/2021 29.4  26.0 - 34.0 pg Final   MCHC 06/06/2021 33.6  30.0 - 36.0 g/dL Final   RDW 27/25/3664 14.4  11.5 - 15.5 % Final   Platelets 06/06/2021 221  150 - 400 K/uL Final   nRBC 06/06/2021 0.0  0.0 - 0.2 % Final   Neutrophils Relative % 06/06/2021 51  % Final   Neutro Abs 06/06/2021 3.7  1.7 - 7.7 K/uL Final   Lymphocytes Relative 06/06/2021 34  % Final   Lymphs Abs 06/06/2021 2.5  0.7 - 4.0 K/uL Final   Monocytes Relative 06/06/2021 11  % Final   Monocytes Absolute 06/06/2021 0.8  0.1 - 1.0 K/uL Final   Eosinophils Relative 06/06/2021 3  % Final   Eosinophils Absolute 06/06/2021 0.2  0.0 - 0.5 K/uL Final   Basophils Relative 06/06/2021 1  % Final   Basophils Absolute 06/06/2021 0.1  0.0 - 0.1 K/uL Final   Immature Granulocytes 06/06/2021 0  % Final   Abs Immature Granulocytes 06/06/2021 0.01  0.00 - 0.07 K/uL Final   Performed at [facility], [address] [place], [place] [address]  Admission on 04/21/2021, Discharged on 04/21/2021  Component Date Value Ref Range Status   SARS Coronavirus 2 04/21/2021 POSITIVE (A) NEGATIVE Final   Comment: (NOTE) SARS-CoV-2 target nucleic acids are DETECTED.  The SARS-CoV-2 RNA is generally detectable in upper and  lower  respiratory specimens during the acute phase of infection. Positive results are indicative of the presence of SARS-CoV-2 RNA. Clinical correlation with patient history and other diagnostic information is  necessary to determine patient infection status. Positive results do not rule out bacterial infection or co-infection with other viruses.  The expected result is Negative.  Fact Sheet for Patients: HairSlick.no  Fact Sheet for Healthcare Providers: quierodirigir.com  This test is not yet approved or cleared by the [place] FDA and  has been authorized for detection and/or diagnosis of SARS-CoV-2 by FDA under an Emergency Use Authorization (EUA). This EUA will remain  in effect (meaning this test can be used) for the duration of the COVID-19 declaration under Section 564(b)(1) of the Act, 21 U.                          S.C. section 360bbb-3(b)(1), unless the authorization is terminated or revoked sooner.   Performed at [facility], [address] [place], [place] [address]     Blood Alcohol level:  Lab Results  Component Value Date   ETH <10 09/10/2021    Metabolic Disorder Labs: Lab Results  Component Value Date   HGBA1C 6.1 (H) 09/10/2021   MPG 128.37 09/10/2021   No results found for: PROLACTIN Lab Results  Component Value Date   CHOL 174 09/10/2021   TRIG 166 (H) 09/10/2021   HDL 52 09/10/2021   CHOLHDL 3.3 09/10/2021   VLDL 33 09/10/2021   LDLCALC 89 09/10/2021    Therapeutic Lab Levels: No results found for: LITHIUM No results found for: VALPROATE No components found for:  CBMZ  Physical Findings   PHQ2-9    Flowsheet Row ED from 01/01/2021 in [facility]  PHQ-2 Total Score 6  PHQ-9 Total Score 24      Flowsheet Row ED from 09/10/2021 in [facility] ED from 07/05/2021 in [facility] ED from  06/06/2021 in [facility]  C-SSRS RISK CATEGORY No Risk No Risk No Risk        Musculoskeletal  Strength & Muscle Tone: within normal limits Gait & Station: normal Patient leans: N/A  Psychiatric Specialty Exam  Presentation  General Appearance: Disheveled  Eye Contact:Fair  Speech:Clear and Coherent; Normal Rate  Speech Volume:Normal  Handedness:Right   Mood and Affect  Mood:Anxious; Depressed; Hopeless  Affect:Congruent   Thought Process  Thought Processes:Coherent  Descriptions of Associations:Intact  Orientation:Full (Time, Place and Person)  Thought Content:Logical     Hallucinations:Hallucinations: None  Ideas of Reference:None  Suicidal Thoughts:Suicidal Thoughts: Yes, Passive SI Passive Intent and/or Plan: Without Intent; Without Plan; With Access to Means  Homicidal Thoughts:Homicidal Thoughts: No   Sensorium  Memory:Immediate Good; Recent Good; Remote Good  Judgment:Fair  Insight:Fair   Executive Functions  Concentration:Good  Attention Span:Good  Recall:Good  Fund of Knowledge:Good  Language:Good   Psychomotor Activity  Psychomotor Activity:Psychomotor Activity: Normal   Assets  Assets:Communication Skills; Desire for Improvement; Financial Resources/Insurance; Housing; Leisure Time; Physical Health; Social Support; Transportation   Sleep  Sleep:Sleep: Good Number of Hours of Sleep: 8   Nutritional Assessment (For OBS and FBC admissions only) Has the patient had a weight loss or gain of 10 pounds or more in the last 3 months?: Yes Has the patient had a decrease in food intake/or appetite?: Yes Does the patient have dental problems?: No Does the patient have eating habits or  behaviors that may be indicators of an eating disorder including binging or inducing vomiting?: No Has the patient recently lost weight without trying?: 1 Has the patient been eating poorly because of a decreased  appetite?: 1 Malnutrition Screening Tool Score: 2    Physical Exam  Physical Exam Vitals and nursing note reviewed.  Constitutional:      Appearance: Normal appearance. She is well-developed. She is not ill-appearing.  HENT:     Head: Normocephalic and atraumatic.  Eyes:     Conjunctiva/sclera: Conjunctivae normal.  Cardiovascular:     Rate and Rhythm: Normal rate.  Pulmonary:     Effort: Pulmonary effort is normal. No respiratory distress.  Musculoskeletal:        General: Normal range of motion.     Cervical back: Normal range of motion.  Skin:    Coloration: Skin is not jaundiced or pale.  Neurological:     Mental Status: She is alert and oriented to person, place, and time.  Psychiatric:        Attention and Perception: Attention and perception normal.        Mood and Affect: Mood is anxious and depressed. Affect is tearful.        Speech: Speech normal.        Behavior: Behavior normal. Behavior is cooperative.        Thought Content: Thought content includes suicidal ideation. Thought content does not include suicidal plan.        Cognition and Memory: Cognition normal.        Judgment: Judgment normal.   Review of Systems  Constitutional: Negative.   HENT: Negative.    Eyes: Negative.   Respiratory: Negative.    Cardiovascular: Negative.   Gastrointestinal: Negative.   Musculoskeletal: Negative.   Skin: Negative.   Neurological: Negative.   Psychiatric/Behavioral:  Positive for depression and suicidal ideas. The patient is nervous/anxious.   Blood pressure (!) 153/96, pulse 70, temperature (!) 97.3 F (36.3 C), temperature source Tympanic, resp. rate 16, height 5\' 2"  (1.575 m), SpO2 99 %. Body mass index is 50.85 kg/m.  Treatment Plan Summary: Daily contact with patient to assess and evaluate symptoms and progress in treatment and Medication management  Disposition: Ongoing.  Patient continues to meet criteria for treatment in the [facility]  Buspar 7.5mg  PO BID  initiated for severe anxiety. If patient tolerates medication it may be increased 5mg  /day increments every 2-3 days until desired efficacy is reached.   [facility], NP 09/11/2021 7:58 AM

## 2021-09-11 NOTE — Progress Notes (Signed)
Patient sitting in front of nurses station reading a book.  Reports feeling better.  Calm, quiet.  Continue to monitor for safety.

## 2021-09-12 ENCOUNTER — Encounter (HOSPITAL_COMMUNITY): Payer: Self-pay | Admitting: Psychiatry

## 2021-09-12 DIAGNOSIS — F1090 Alcohol use, unspecified, uncomplicated: Secondary | ICD-10-CM | POA: Diagnosis not present

## 2021-09-12 DIAGNOSIS — F332 Major depressive disorder, recurrent severe without psychotic features: Secondary | ICD-10-CM | POA: Diagnosis not present

## 2021-09-12 DIAGNOSIS — Z20822 Contact with and (suspected) exposure to covid-19: Secondary | ICD-10-CM | POA: Diagnosis not present

## 2021-09-12 DIAGNOSIS — Z09 Encounter for follow-up examination after completed treatment for conditions other than malignant neoplasm: Secondary | ICD-10-CM | POA: Diagnosis not present

## 2021-09-12 LAB — HCV INTERPRETATION

## 2021-09-12 MED ORDER — FLUOXETINE HCL 10 MG PO CAPS: 10.0000 mg | ORAL_CAPSULE | Freq: Every day | ORAL | 0 refills | Status: AC

## 2021-09-12 MED ORDER — BUSPIRONE HCL 7.5 MG PO TABS: 7.5000 mg | ORAL_TABLET | Freq: Two times a day (BID) | ORAL | 0 refills | Status: AC

## 2021-09-12 MED ORDER — TRAZODONE HCL 50 MG PO TABS: 50.0000 mg | ORAL_TABLET | Freq: Every evening | ORAL | 0 refills | Status: AC | PRN

## 2021-09-12 NOTE — ED Notes (Signed)
Pt sleeping in no acute distress. RR even and unlabored. Safety maintained. 

## 2021-09-12 NOTE — Clinical Social Work Psych Note (Signed)
CSW Discharge Note   CSW met with patient to discuss any additional questions or concerns related to her discharge.   Tracey Cobb reports feeling "good" this morning. She expressed she was ready for discharge. Tracey Cobb is returning home with her parents. She reports her parents will be picking her up at discharge.   Tracey Cobb reports her medications appear to be working well and that she feels confident about managing her symptoms moving forward.   CSW charted [facility] Outpatient Clinic resources in the patient's AVS for continuity of care. Outpatient continues to operate on a walk-in services to establish care. CSW also provided additional resources for grief counseling services in the area.   Tracey Cobb denied having any SI, HI, AVH at this time.   Tracey Cobb denied having any additional questions or concerns at this time.    CSW will continue to follow for discharge    Tracey Cobb, MSW, LCSW Clinical Social Worker (Facility Based Crisis) [facility]   

## 2021-09-12 NOTE — ED Notes (Signed)
Patient A&Ox4. Denies intent to harm self/others when asked. Denies A/VH. Patient denies any physical complaints when asked. No acute distress noted. At present patient rates his mood (6/7-10). Support and encouragement provided. Routine safety checks conducted according to facility protocol. Encouraged patient to notify staff if thoughts of harm toward self or others arise. Patient verbalize understanding and agreement. Patient verbally contracts for safety at this. Will continue to monitor.

## 2021-09-12 NOTE — ED Notes (Signed)
Pt asleep in bed. Respirations even and unlabored. Will continue to monitor for safety. ?

## 2021-09-12 NOTE — Group Note (Signed)
Group Topic: Understanding Self  Group Date: 09/12/2021 Start Time: 1030 End Time: 1125 Facilitators: Alexandria Lodge D, NT  Department: [facility]  Number of Participants: 4  Group Focus: acceptance, affirmation, communication, coping skills, daily focus, family, feeling awareness/expression, healthy friendships, safety plan, and self-esteem Treatment Modality:  Cognitive Behavioral Therapy and Solution-Focused Therapy Interventions utilized were clarification, patient education, story telling, and support Purpose: enhance coping skills, express feelings, improve communication skills, increase insight, regain self-worth, and reinforce self-care  Name: Shella Lahman Date of Birth: Apr 15, 1971  MR: [id number]    Level of Participation: active Quality of Participation: cooperative Interactions with others: gave feedback and intrusive Mood/Affect: bright and positive Triggers (if applicable): Husband death Cognition: concrete and insightful Progress: Moderate Response:  Plan: patient will be encouraged to set daily goals, learn positive affirmation, learn positive coping skills.  Patients Problems:  Patient Active Problem List   Diagnosis Date Noted   MDD (major depressive disorder), recurrent severe, without psychosis (HCC) 09/10/2021

## 2021-09-12 NOTE — Discharge Summary (Signed)
Patient was discharged to home today. Patient was escorted and transported by her mother. Patient was given discharge instructions that included her current medications and community resources. Patient verbalized understanding of discharge instructions to follow up with mental  health therapy as a walk-in patient. Patient denies SI/HI and AVH. Patient maintained safety while in facility.

## 2021-09-12 NOTE — ED Provider Notes (Signed)
FBC/OBS ASAP Discharge Summary  Date and Time: 09/12/2021 11:11 AM  Name: Tracey Cobb  MRN:  [id number]   Discharge Diagnoses:  Final diagnoses:  Severe episode of recurrent major depressive disorder, without psychotic features (HCC)    Subjective: "I'm feeling much better.   Tracey Cobb, 50 y.o., female patient seen face to face by this provider, consulted with Dr. Nelly Rout; and chart reviewed on 09/12/21.  On evaluation Tracey Cobb reports she feels fine today.  Patient denies any suicidal thoughts or self-harm thoughts at this time.  Patient reporting her main objective was getting set up with outpatient psychiatric services.  Reports she was Prozac and BuSpar but had no outpatient provider to continue medications for her.  Patient reporting that she has a good support system with her parents and her 32 year old daughter.  Patient reporting she is employed at [employer].  Patient reported main stressor was the loss of her husband in January and not having any therapy to help deal with the. During evaluation Tracey Cobb is in bed in no acute distress.  She is alert, oriented x 4, calm, cooperative and attentive.  Her mood is dysphoric with congruent affect.  She has normal speech, and behavior.  Objectively there is no evidence of psychosis/mania or delusional thinking.  Patient is able to converse coherently, goal directed thoughts, no distractibility, or pre-occupation.  She also denies suicidal/self-harm/homicidal ideation, psychosis, and paranoia.  Patient answered question appropriately.      Stay Summary: Tracey Cobb was admitted to [facility] Continuous Assessment unit for MDD (major depressive disorder), recurrent severe, without psychosis (HCC) and crisis management.  She was restarted on Prozac 10 mg daily, BuSpar 7.5 mg twice a day, and trazodone 50 mg daily at bedtime as needed for sleep.  Reported tolerating medications well without any adverse reaction.   Tracey Cobb  was discharged with a 30-day supply of medication and was instructed on how to take medications as prescribed; (details listed below under Medication List).   Tracey Cobb's improvement was monitored by continuous assessment/observation and her report of symptom reduction.  Her emotional and mental status was also monitored by staff.           Tracey Cobb was evaluated for stability and plans for continued recovery upon discharge.  Tracey Cobb motivation was an integral factor for scheduling further treatment.  The following was addressed as part of her discharge planning and follow up treatment:  Employment, housing, transportation, health status, family support, and any pending legal issues were also considered during her admission.  She was offered further treatment options upon discharge including but not limited to Residential, Intensive Outpatient, Outpatient treatment, Rehabilitation services, and resources for shelters and Half-way-house if needed.  Tracey Cobb will follow up with the services as listed below under Follow up Information.    Upon completion of this admission the Tracey Cobb was both mentally and medically stable for discharge denying suicidal/homicidal ideation, auditory/visual/tactile hallucinations, delusional thoughts and paranoia.     Total Time spent with patient: 45 minutes  Past Psychiatric History: Major depression Past Medical History:  Past Medical History:  Diagnosis Date   Asthma    Depression    Hypertension    Nystagmus    Ovarian cyst    Polycystic kidney disease     Past Surgical History:  Procedure Laterality Date   CESAREAN SECTION     CHOLECYSTECTOMY     Family History:  Family History  Adopted: Yes   Family  Psychiatric History: None reported Social History:  Social History   Substance and Sexual Activity  Alcohol Use Yes   Alcohol/week: 14.0 standard drinks   Types: 14 Shots of liquor per week   Comment: On 12/30/20     Social  History   Substance and Sexual Activity  Drug Use Not Currently    Social History   Socioeconomic History   Marital status: Widowed    Spouse name: Not on file   Number of children: 2   Years of education: Not on file   Highest education level: Some college, no degree  Occupational History   Not on file  Tobacco Use   Smoking status: Never   Smokeless tobacco: Never  Vaping Use   Vaping Use: Former  Substance and Sexual Activity   Alcohol use: Yes    Alcohol/week: 14.0 standard drinks    Types: 14 Shots of liquor per week    Comment: On 12/30/20   Drug use: Not Currently   Sexual activity: Not Currently  Other Topics Concern   Not on file  Social History Narrative   Not on file   Social Determinants of Health   Financial Resource Strain: Not on file  Food Insecurity: Not on file  Transportation Needs: Not on file  Physical Activity: Not on file  Stress: Not on file  Social Connections: Not on file   SDOH:  SDOH Screenings   Alcohol Screen: Not on file  Depression (PHQ2-9): Medium Risk   PHQ-2 Score: 24  Financial Resource Strain: Not on file  Food Insecurity: Not on file  Housing: Not on file  Physical Activity: Not on file  Social Connections: Not on file  Stress: Not on file  Tobacco Use: Low Risk    Smoking Tobacco Use: Never   Smokeless Tobacco Use: Never  Transportation Needs: Not on file    Tobacco Cessation:  N/A, patient does not currently use tobacco products  Current Medications:  Current Facility-Administered Medications  Medication Dose Route Frequency Provider Last Rate Last Admin   acetaminophen (TYLENOL) tablet 650 mg  650 mg Oral Q6H PRN Ardis Hughs, NP       alum & mag hydroxide-simeth (MAALOX/MYLANTA) 200-200-20 MG/5ML suspension 30 mL  30 mL Oral Q4H PRN Ardis Hughs, NP       busPIRone (BUSPAR) tablet 7.5 mg  7.5 mg Oral BID Ardis Hughs, NP   7.5 mg at 09/12/21 2423   FLUoxetine (PROZAC) capsule 10 mg  10 mg  Oral Daily Ardis Hughs, NP   10 mg at 09/12/21 5361   magnesium hydroxide (MILK OF MAGNESIA) suspension 30 mL  30 mL Oral Daily PRN Ardis Hughs, NP       traZODone (DESYREL) tablet 50 mg  50 mg Oral QHS PRN Ardis Hughs, NP       Current Outpatient Medications  Medication Sig Dispense Refill   albuterol (VENTOLIN HFA) 108 (90 Base) MCG/ACT inhaler Inhale 2 puffs into the lungs every 4 (four) hours as needed for wheezing or shortness of breath.      PTA Medications: (Not in a hospital admission)   Musculoskeletal  Strength & Muscle Tone: within normal limits Gait & Station: normal Patient leans: N/A  Psychiatric Specialty Exam  Presentation  General Appearance: Appropriate for Environment  Eye Contact:Good  Speech:Clear and Coherent; Normal Rate  Speech Volume:Normal  Handedness:Right   Mood and Affect  Mood:Dysphoric  Affect:Appropriate; Congruent   Thought Process  Thought Processes:Coherent;  Goal Directed  Descriptions of Associations:Intact  Orientation:Full (Time, Place and Person)  Thought Content:WDL; Logical     Hallucinations:Hallucinations: None  Ideas of Reference:None  Suicidal Thoughts:Suicidal Thoughts: No SI Passive Intent and/or Plan: Without Intent; Without Plan; With Access to Means  Homicidal Thoughts:Homicidal Thoughts: No   Sensorium  Memory:Immediate Good; Recent Good; Remote Good  Judgment:Intact  Insight:Present   Executive Functions  Concentration:Good  Attention Span:Good  Recall:Good  Fund of Knowledge:Good  Language:Good   Psychomotor Activity  Psychomotor Activity:Psychomotor Activity: Normal   Assets  Assets:Communication Skills; Desire for Improvement; Housing; Social Support   Sleep  Sleep:Sleep: Good Number of Hours of Sleep: 8   Nutritional Assessment (For OBS and FBC admissions only) Has the patient had a weight loss or gain of 10 pounds or more in the last 3 months?: No Has  the patient had a decrease in food intake/or appetite?: No Does the patient have dental problems?: No Does the patient have eating habits or behaviors that may be indicators of an eating disorder including binging or inducing vomiting?: No Has the patient recently lost weight without trying?: 0 Has the patient been eating poorly because of a decreased appetite?: 0 Malnutrition Screening Tool Score: 0    Physical Exam  Physical Exam Vitals and nursing note reviewed.  Constitutional:      General: She is not in acute distress.    Appearance: Normal appearance. She is well-developed. She is obese. She is not ill-appearing.  Cardiovascular:     Rate and Rhythm: Normal rate.  Pulmonary:     Effort: Pulmonary effort is normal. No respiratory distress.  Musculoskeletal:        General: Normal range of motion.     Cervical back: Normal range of motion.  Skin:    Coloration: Skin is not jaundiced or pale.  Neurological:     Mental Status: She is alert and oriented to person, place, and time.  Psychiatric:        Attention and Perception: Attention and perception normal.        Mood and Affect: Mood and affect normal.        Speech: Speech normal.        Behavior: Behavior normal. Behavior is cooperative.        Thought Content: Thought content is not paranoid or delusional. Thought content includes suicidal ideation. Thought content does not include homicidal ideation.        Cognition and Memory: Cognition and memory normal.        Judgment: Judgment normal.   Review of Systems  Constitutional: Negative.   HENT: Negative.    Eyes: Negative.   Respiratory: Negative.    Cardiovascular: Negative.   Gastrointestinal: Negative.   Musculoskeletal: Negative.   Skin: Negative.   Neurological: Negative.   Psychiatric/Behavioral:  Positive for substance abuse. Depression: Stable. Hallucinations: Denies. Suicidal ideas: Denies.Nervous/anxious: Stable.   Blood pressure 128/88, pulse 76,  temperature 97.7 F (36.5 C), temperature source Temporal, resp. rate 18, height 5\' 2"  (1.575 m), SpO2 99 %. Body mass index is 50.85 kg/m.  Demographic Factors:  Caucasian and Female  Loss Factors: Loss of significant relationship  Historical Factors: Depression  Risk Reduction Factors:   Responsible for children under 78 years of age, Sense of responsibility to family, Religious beliefs about death, Employed, and Positive social support  Continued Clinical Symptoms:  Previous Psychiatric Diagnoses and Treatments  Cognitive Features That Contribute To Risk:  None    Suicide Risk:  Minimal:  No identifiable suicidal ideation.  Patients presenting with no risk factors but with morbid ruminations; may be classified as minimal risk based on the severity of the depressive symptoms  Plan Of Care/Follow-up recommendations:  Activity:  As tolerated  Disposition: No evidence of imminent risk to self or others at present.   Patient does not meet criteria for psychiatric inpatient admission. Supportive therapy provided about ongoing stressors. Refer to IOP. Discussed crisis plan, support from social network, calling 911, coming to the Emergency Department, and calling Suicide Hotline.   Follow-up Information     [facility] Go to.   Specialty: Behavioral Health Why: Please go during walk-in hours to establish outpatient psychiatric services.   Therapy Walk-in hours are Monday-Wednesday from 8:00am-until slots are filled. Please be sure to arrive by 7:30am-7:45am as patients are seen on a first come, first served basis. Friday hours are from 1:00pm-5:00pm  Medication Management Walk-in hours are Monday-Friday from 8:00am-11:00am. Please be sure to arrive by 7:30am-7:45am as patients are seen on a first come, first served basis. Contact information: [address] [place] [address] [phone]                  Discharge  Instructions      Patient is instructed prior to discharge to: Take all medications as prescribed by his/her mental healthcare provider. Report any adverse effects and or reactions from the medicines to his/her outpatient provider promptly. Patient has been instructed & cautioned: To not engage in alcohol and or illegal drug use while on prescription medicines. In the event of worsening symptoms, patient is instructed to call the crisis hotline, 911 and or go to the nearest ED for appropriate evaluation and treatment of symptoms. To follow-up with his/her primary care provider for your other medical issues, concerns and or health care needs.        Dilan Novosad, NP 09/12/2021, 11:11 AM

## 2021-09-18 LAB — HEPATITIS PANEL, ACUTE
Hep A IgM: NONREACTIVE
Hep B C IgM: NONREACTIVE
Hepatitis B Surface Ag: NONREACTIVE

## 2021-09-21 ENCOUNTER — Other Ambulatory Visit: Payer: Self-pay

## 2021-09-21 ENCOUNTER — Ambulatory Visit (INDEPENDENT_AMBULATORY_CARE_PROVIDER_SITE_OTHER): Payer: No Payment, Other | Admitting: Clinical

## 2021-09-21 DIAGNOSIS — F331 Major depressive disorder, recurrent, moderate: Secondary | ICD-10-CM

## 2021-09-21 DIAGNOSIS — F4321 Adjustment disorder with depressed mood: Secondary | ICD-10-CM
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
## 2021-10-14 ENCOUNTER — Other Ambulatory Visit: Payer: Self-pay

## 2021-10-14 ENCOUNTER — Emergency Department (HOSPITAL_BASED_OUTPATIENT_CLINIC_OR_DEPARTMENT_OTHER)
Admission: EM | Admit: 2021-10-14 | Discharge: 2021-10-15 | Disposition: A | Discharge status: Home / Self Care | Payer: Self-pay | Attending: Emergency Medicine | Admitting: Emergency Medicine

## 2021-10-14 ENCOUNTER — Encounter (HOSPITAL_BASED_OUTPATIENT_CLINIC_OR_DEPARTMENT_OTHER): Payer: Self-pay | Admitting: *Deleted

## 2021-10-14 DIAGNOSIS — J45909 Unspecified asthma, uncomplicated: Secondary | ICD-10-CM | POA: Insufficient documentation

## 2021-10-14 DIAGNOSIS — J101 Influenza due to other identified influenza virus with other respiratory manifestations: Secondary | ICD-10-CM | POA: Insufficient documentation

## 2021-10-14 DIAGNOSIS — I10 Essential (primary) hypertension: Secondary | ICD-10-CM | POA: Insufficient documentation

## 2021-10-14 LAB — RAPID INFLUENZA A&B ANTIGENS
Influenza A (ARMC): NEGATIVE
Influenza B (ARMC): NEGATIVE

## 2021-10-14 NOTE — ED Triage Notes (Signed)
Cough and fever since yesterday- family has the flu- took tylenol at 1000- states she vomited x 1 after coughing and took promethazine

## 2021-10-15 MED ORDER — ALBUTEROL SULFATE HFA 108 (90 BASE) MCG/ACT IN AERS
2.0000 | INHALATION_SPRAY | Freq: Once | RESPIRATORY_TRACT | Status: AC
  Administered 2021-10-15: 2 via RESPIRATORY_TRACT
  Filled 2021-10-15: qty 6.7

## 2021-10-15 MED ORDER — DEXAMETHASONE 4 MG PO TABS
10.0000 mg | ORAL_TABLET | Freq: Once | ORAL | Status: AC
  Administered 2021-10-15: 10 mg via ORAL
  Filled 2021-10-15: qty 3

## 2021-10-15 MED ORDER — BENZONATATE 100 MG PO CAPS: 100.0000 mg | ORAL_CAPSULE | Freq: Three times a day (TID) | ORAL | 0 refills | Status: AC

## 2021-10-15 NOTE — ED Provider Notes (Signed)
[facility] EMERGENCY DEPARTMENT Provider Note   CSN: [id number] Arrival date & time: 10/14/21  2029     History Chief Complaint  Patient presents with   Fever   Cough    Tracey Cobb is a 50 y.o. female.  The history is provided by the patient.  Fever Associated symptoms: cough   Cough Associated symptoms: fever   Tracey Cobb is a 50 y.o. female who presents to the Emergency Department complaining of cough.  She presents to the ED for evaluation of productive cough since yesterday with associated generalized weakness.  Has associated dizziness and temperature to 99.5.    Has abdominal pain with coughing.  No diarrhea.  Had posttussive emesis tonight.  No leg swelling.    Has a hx/o asthma, polycystic kidney disease.  Uses albuterol - tried today without relief.  Took dayquil with minimal improvement in sxs.    Parents both positive for flu.      Past Medical History:  Diagnosis Date   Asthma    Depression    Hypertension    Nystagmus    Ovarian cyst    Polycystic kidney disease     Patient Active Problem List   Diagnosis Date Noted   MDD (major depressive disorder), recurrent severe, without psychosis (HCC) 09/10/2021    Past Surgical History:  Procedure Laterality Date   CESAREAN SECTION     CHOLECYSTECTOMY       OB History   No obstetric history on file.     Family History  Adopted: Yes    Social History   Tobacco Use   Smoking status: Never   Smokeless tobacco: Never  Vaping Use   Vaping Use: Former  Substance Use Topics   Alcohol use: Yes    Alcohol/week: 14.0 standard drinks    Types: 14 Shots of liquor per week    Comment: On 12/30/20   Drug use: Not Currently    Home Medications Prior to Admission medications   Medication Sig Start Date End Date Taking? Authorizing Provider  benzonatate (TESSALON) 100 MG capsule Take 1 capsule (100 mg total) by mouth every 8 (eight) hours. 10/15/21  Yes Tilden Fossa, MD  albuterol  (VENTOLIN HFA) 108 (90 Base) MCG/ACT inhaler Inhale 2 puffs into the lungs every 4 (four) hours as needed for wheezing or shortness of breath. 03/04/20   [provider]  busPIRone (BUSPAR) 7.5 MG tablet Take 1 tablet (7.5 mg total) by mouth 2 (two) times daily. 09/12/21   Rankin, Shuvon B, NP  FLUoxetine (PROZAC) 10 MG capsule Take 1 capsule (10 mg total) by mouth daily. 09/13/21   Rankin, Shuvon B, NP  traZODone (DESYREL) 50 MG tablet Take 1 tablet (50 mg total) by mouth at bedtime as needed for sleep. 09/12/21   Rankin, Shuvon B, NP    Allergies    Cantaloupe (diagnostic), Watermelon flavor, Ivp dye [iodinated diagnostic agents], Benadryl [diphenhydramine], Ciprofloxacin, and Reglan [metoclopramide]  Review of Systems   Review of Systems  Constitutional:  Positive for fever.  Respiratory:  Positive for cough.   All other systems reviewed and are negative.  Physical Exam Updated Vital Signs BP (!) 148/85   Pulse (!) 107   Temp 99.9 F (37.7 C) (Oral)   Resp 20   Ht 5\' 2"  (1.575 m)   Wt 124.7 kg   LMP 09/17/2021 (Approximate)   SpO2 96%   BMI 50.30 kg/m   Physical Exam Vitals and nursing note reviewed.  Constitutional:  Appearance: She is well-developed.  HENT:     Head: Normocephalic and atraumatic.  Cardiovascular:     Rate and Rhythm: Normal rate and regular rhythm.     Heart sounds: No murmur heard. Pulmonary:     Effort: Pulmonary effort is normal. No respiratory distress.     Breath sounds: Normal breath sounds.  Abdominal:     Palpations: Abdomen is soft.     Tenderness: There is no abdominal tenderness. There is no guarding or rebound.  Musculoskeletal:        General: No tenderness.     Comments: Trace edema to BLE  Skin:    General: Skin is warm and dry.  Neurological:     Mental Status: She is alert and oriented to person, place, and time.  Psychiatric:        Behavior: Behavior normal.    ED Results / Procedures / Treatments   Labs (all  labs ordered are listed, but only abnormal results are displayed) Labs Reviewed  RAPID INFLUENZA A&B ANTIGENS    EKG None  Radiology No results found.  Procedures Procedures   Medications Ordered in ED Medications  dexamethasone (DECADRON) tablet 10 mg (10 mg Oral Given 10/15/21 0056)  albuterol (VENTOLIN HFA) 108 (90 Base) MCG/ACT inhaler 2 puff (2 puffs Inhalation Given 10/15/21 0057)    ED Course  I have reviewed the triage vital signs and the nursing notes.  Pertinent labs & imaging results that were available during my care of the patient were reviewed by me and considered in my medical decision making (see chart for details).    MDM Rules/Calculators/A&P                          patient here for evaluation of fever, cough. She is non-toxic appearing on evaluation with no respiratory distress. She does have household contacts that are positive for influenza. She does have a history of asthma. She has no respiratory distress to the emergency department. Given her asthma history she was treated with one-time dose of Decadron. Discussed risk and benefits of Tamiflu, will not treat at this time. Discussed home care for influenza with outpatient follow-up and return precautions  Final Clinical Impression(s) / ED Diagnoses Final diagnoses:  Influenza A    Rx / DC Orders ED Discharge Orders          Ordered    benzonatate (TESSALON) 100 MG capsule  Every 8 hours        10/15/21 0051             Quintella Reichert, MD 10/15/21 [phone]

## 2021-11-12 ENCOUNTER — Ambulatory Visit (HOSPITAL_COMMUNITY): Payer: No Payment, Other | Admitting: Clinical

## 2021-12-13 ENCOUNTER — Encounter (HOSPITAL_BASED_OUTPATIENT_CLINIC_OR_DEPARTMENT_OTHER): Payer: Self-pay | Admitting: *Deleted

## 2021-12-13 ENCOUNTER — Emergency Department (HOSPITAL_BASED_OUTPATIENT_CLINIC_OR_DEPARTMENT_OTHER)
Admission: EM | Admit: 2021-12-13 | Discharge: 2021-12-13 | Disposition: A | Discharge status: Home / Self Care | Payer: Self-pay | Attending: Emergency Medicine | Admitting: Emergency Medicine

## 2021-12-13 ENCOUNTER — Other Ambulatory Visit: Payer: Self-pay

## 2021-12-13 DIAGNOSIS — J45909 Unspecified asthma, uncomplicated: Secondary | ICD-10-CM | POA: Insufficient documentation

## 2021-12-13 DIAGNOSIS — H9202 Otalgia, left ear: Secondary | ICD-10-CM | POA: Insufficient documentation

## 2021-12-13 DIAGNOSIS — I10 Essential (primary) hypertension: Secondary | ICD-10-CM | POA: Insufficient documentation

## 2021-12-13 DIAGNOSIS — J029 Acute pharyngitis, unspecified: Secondary | ICD-10-CM | POA: Insufficient documentation

## 2021-12-13 MED ORDER — KETOROLAC TROMETHAMINE 60 MG/2ML IM SOLN
30.0000 mg | Freq: Once | INTRAMUSCULAR | Status: AC
  Administered 2021-12-13: 30 mg via INTRAMUSCULAR
  Filled 2021-12-13: qty 2

## 2021-12-13 NOTE — ED Notes (Signed)
Pt refused respiratory panel.

## 2021-12-13 NOTE — ED Provider Notes (Signed)
[facility] EMERGENCY DEPARTMENT Provider Note  CSN: [id number] Arrival date & time: 12/13/21 0146  Chief Complaint(s) Otalgia  HPI Tracey Cobb is a 51 y.o. female    Otalgia Location:  Left Quality:  Hervey Ard Severity:  Moderate Onset quality:  Gradual Duration: several hours. Timing:  Constant Progression:  Waxing and waning Chronicity:  New Context: not direct blow, not elevation change, not foreign body in ear, not loud noise and not water in ear   Relieved by:  Nothing Worsened by:  Palpation Associated symptoms: sore throat ("a little")   Associated symptoms: no abdominal pain, no congestion, no cough, no diarrhea, no ear discharge, no fever, no headaches, no hearing loss, no neck pain, no rash, no rhinorrhea, no tinnitus and no vomiting    Past Medical History Past Medical History:  Diagnosis Date   Asthma    Depression    Hypertension    Nystagmus    Ovarian cyst    Polycystic kidney disease    Patient Active Problem List   Diagnosis Date Noted   MDD (major depressive disorder), recurrent severe, without psychosis ([place]) 09/10/2021   Home Medication(s) Prior to Admission medications   Medication Sig Start Date End Date Taking? Authorizing Provider  albuterol (VENTOLIN HFA) 108 (90 Base) MCG/ACT inhaler Inhale 2 puffs into the lungs every 4 (four) hours as needed for wheezing or shortness of breath. 03/04/20   [provider]  benzonatate (TESSALON) 100 MG capsule Take 1 capsule (100 mg total) by mouth every 8 (eight) hours. 10/15/21   Quintella Reichert, MD  busPIRone (BUSPAR) 7.5 MG tablet Take 1 tablet (7.5 mg total) by mouth 2 (two) times daily. 09/12/21   Rankin, Shuvon B, NP  FLUoxetine (PROZAC) 10 MG capsule Take 1 capsule (10 mg total) by mouth daily. 09/13/21   Rankin, Shuvon B, NP  traZODone (DESYREL) 50 MG tablet Take 1 tablet (50 mg total) by mouth at bedtime as needed for sleep. 09/12/21   Rankin, Shuvon B, NP                                                                                                                                     Allergies Cantaloupe (diagnostic), Watermelon flavor, Ivp dye [iodinated contrast media], Benadryl [diphenhydramine], Ciprofloxacin, and Reglan [metoclopramide]  Review of Systems Review of Systems  Constitutional:  Negative for fever.  HENT:  Positive for ear pain and sore throat ("a little"). Negative for congestion, ear discharge, hearing loss, rhinorrhea and tinnitus.   Respiratory:  Negative for cough.   Gastrointestinal:  Negative for abdominal pain, diarrhea and vomiting.  Musculoskeletal:  Negative for neck pain.  Skin:  Negative for rash.  Neurological:  Negative for headaches.  As noted in HPI  Physical Exam Vital Signs  I have reviewed the triage vital signs BP 126/69 (BP Location: Left Arm)    Pulse 88    Temp 98.1 F (36.7 C) (  Oral)    Resp 18    Ht 5\' 2"  (1.575 m)    Wt 125.2 kg    LMP 11/20/2021 (Approximate)    SpO2 95%    BMI 50.48 kg/m   Physical Exam Vitals reviewed.  Constitutional:      General: She is not in acute distress.    Appearance: She is well-developed. She is not diaphoretic.  HENT:     Head: Normocephalic and atraumatic.     Right Ear: Tympanic membrane and external ear normal. No swelling or tenderness. No middle ear effusion. No mastoid tenderness. Tympanic membrane is not injected or erythematous.     Left Ear: Tympanic membrane and external ear normal. Tenderness (to tragus) present. No swelling.  No middle ear effusion. No mastoid tenderness. Tympanic membrane is not injected or erythematous.     Ears:      Nose: Nose normal.     Mouth/Throat:     Dentition: Abnormal dentition. No dental tenderness.     Palate: No lesions.     Pharynx: Oropharynx is clear.     Tonsils: No tonsillar exudate or tonsillar abscesses.  Eyes:     General: No scleral icterus.    Conjunctiva/sclera: Conjunctivae normal.  Neck:     Trachea: Phonation normal.   Cardiovascular:     Rate and Rhythm: Normal rate and regular rhythm.  Pulmonary:     Effort: Pulmonary effort is normal. No respiratory distress.     Breath sounds: No stridor.  Abdominal:     General: There is no distension.  Musculoskeletal:        General: Normal range of motion.     Cervical back: Normal range of motion.  Neurological:     Mental Status: She is alert and oriented to person, place, and time.  Psychiatric:        Behavior: Behavior normal.    ED Results and Treatments Labs (all labs ordered are listed, but only abnormal results are displayed) Labs Reviewed - No data to display                                                                                                                       EKG  EKG Interpretation  Date/Time:    Ventricular Rate:    PR Interval:    QRS Duration:   QT Interval:    QTC Calculation:   R Axis:     Text Interpretation:         Radiology No results found.  Pertinent labs & imaging results that were available during my care of the patient were reviewed by me and considered in my medical decision making (see MDM for details).  Medications Ordered in ED Medications  ketorolac (TORADOL) injection 30 mg (30 mg Intramuscular Given 12/13/21 0352)  Procedures Procedures  (including critical care time)  Medical Decision Making / ED Course     Left otalgia No evidence of AOM or externa. No evidence of pharyngitis. No LAD Tragus TTP. No erythema. No pustule No neck pain concerning for dissection Pain resolved with toradol.   Final Clinical Impression(s) / ED Diagnoses Final diagnoses:  Left ear pain   The patient appears reasonably screened and/or stabilized for discharge and I doubt any other medical condition or other [facility] requiring further screening, evaluation, or treatment in the ED  at this time prior to discharge. Safe for discharge with strict return precautions.  Disposition: Discharge  Condition: Good  I have discussed the results, Dx and Tx plan with the patient/family who expressed understanding and agree(s) with the plan. Discharge instructions discussed at length. The patient/family was given strict return precautions who verbalized understanding of the instructions. No further questions at time of discharge.    ED Discharge Orders     None        Follow Up: Primary care provider  Call  to schedule an appointment for close follow up           This chart was dictated using voice recognition software.  Despite best efforts to proofread,  errors can occur which can change the documentation meaning.    Fatima Blank, MD 12/13/21 [phone]

## 2021-12-13 NOTE — ED Triage Notes (Signed)
Pt report left ear pain today, 10/10. States she had "a little sore throat" first

## 2022-01-15 ENCOUNTER — Encounter (HOSPITAL_BASED_OUTPATIENT_CLINIC_OR_DEPARTMENT_OTHER): Payer: Self-pay | Admitting: Urology

## 2022-01-15 ENCOUNTER — Emergency Department (HOSPITAL_BASED_OUTPATIENT_CLINIC_OR_DEPARTMENT_OTHER): Payer: Self-pay

## 2022-01-15 ENCOUNTER — Emergency Department (HOSPITAL_BASED_OUTPATIENT_CLINIC_OR_DEPARTMENT_OTHER)
Admission: EM | Admit: 2022-01-15 | Discharge: 2022-01-15 | Disposition: A | Discharge status: Home / Self Care | Payer: Self-pay | Attending: Emergency Medicine | Admitting: Emergency Medicine

## 2022-01-15 ENCOUNTER — Other Ambulatory Visit: Payer: Self-pay

## 2022-01-15 DIAGNOSIS — J4541 Moderate persistent asthma with (acute) exacerbation: Secondary | ICD-10-CM

## 2022-01-15 DIAGNOSIS — J45901 Unspecified asthma with (acute) exacerbation: Secondary | ICD-10-CM | POA: Insufficient documentation

## 2022-01-15 DIAGNOSIS — R112 Nausea with vomiting, unspecified: Secondary | ICD-10-CM | POA: Insufficient documentation

## 2022-01-15 DIAGNOSIS — Z20822 Contact with and (suspected) exposure to covid-19: Secondary | ICD-10-CM | POA: Insufficient documentation

## 2022-01-15 DIAGNOSIS — J209 Acute bronchitis, unspecified: Secondary | ICD-10-CM | POA: Insufficient documentation

## 2022-01-15 LAB — RESP PANEL BY RT-PCR (FLU A&B, COVID) ARPGX2
Influenza A by PCR: NEGATIVE
Influenza B by PCR: NEGATIVE
SARS Coronavirus 2 by RT PCR: NEGATIVE

## 2022-01-15 MED ORDER — ALBUTEROL SULFATE HFA 108 (90 BASE) MCG/ACT IN AERS: 1.0000 | INHALATION_SPRAY | RESPIRATORY_TRACT | 1 refills | Status: AC | PRN

## 2022-01-15 MED ORDER — PREDNISONE 50 MG PO TABS: 50.0000 mg | ORAL_TABLET | Freq: Every day | ORAL | 0 refills | Status: AC

## 2022-01-15 NOTE — ED Notes (Signed)
Pt talking on the phone and left the department

## 2022-01-15 NOTE — ED Notes (Signed)
Pt back to room  EDP at bedside

## 2022-01-15 NOTE — ED Provider Notes (Signed)
22:33: This patient was assigned room, I signed up for her case, but had not had a chance to see her yet.  At 22:35 patient was talking on the phone and left the department.  I will stay centered for this patient until we clarify whether patient eloped.   Tracey Cobb 01/15/22 2246    Hayden Rasmussen, MD 01/16/22 [phone]

## 2022-01-15 NOTE — Discharge Instructions (Addendum)
As we discussed your symptoms are consistent with an asthma exacerbation versus acute bronchitis.  Continue use your inhaler, take the steroids that I prescribed.  I recommend you follow-up with your primary care doctor, return to the emergency department if your symptoms are worsening despite treatment.

## 2022-01-15 NOTE — ED Triage Notes (Signed)
Productive cough x 3 days Headache, lightheaded  Reports mild fever  States Nausea/vomiting but tolerating fluids

## 2022-01-15 NOTE — ED Provider Notes (Signed)
[facility] EMERGENCY DEPARTMENT Provider Note   CSN: [id number] Arrival date & time: 01/15/22  2008     History  Chief Complaint  Patient presents with   Cough    Tracey Cobb is a 51 y.o. female with past medical history significant for asthma who complains of cough, sore throat, occasional fever, chills, shortness of breath without chest pain for the last 6 days.  Patient reports she has had increased use of her inhaler.  Patient reports that she has had very minimal production of blood with cough.  As well as production of yellow sputum.  Patient reports yearly history of acute bronchitis, asthma exacerbations.  Additionally patient with history of hypertension, obesity.  She has endorsed some fatigue, headaches, denies active headache at time of my evaluation.  She does endorse vomiting x1.   Cough Associated symptoms: shortness of breath       Home Medications Prior to Admission medications   Medication Sig Start Date End Date Taking? Authorizing Provider  predniSONE (DELTASONE) 50 MG tablet Take 1 tablet (50 mg total) by mouth daily. 01/15/22  Yes Kent Riendeau H, PA-C  albuterol (VENTOLIN HFA) 108 (90 Base) MCG/ACT inhaler Inhale 1-2 puffs into the lungs every 4 (four) hours as needed for wheezing or shortness of breath. 01/15/22   Kenia Teagarden H, PA-C  benzonatate (TESSALON) 100 MG capsule Take 1 capsule (100 mg total) by mouth every 8 (eight) hours. 10/15/21   Tilden Fossa, MD  busPIRone (BUSPAR) 7.5 MG tablet Take 1 tablet (7.5 mg total) by mouth 2 (two) times daily. 09/12/21   Rankin, Shuvon B, NP  FLUoxetine (PROZAC) 10 MG capsule Take 1 capsule (10 mg total) by mouth daily. 09/13/21   Rankin, Shuvon B, NP  traZODone (DESYREL) 50 MG tablet Take 1 tablet (50 mg total) by mouth at bedtime as needed for sleep. 09/12/21   Rankin, Shuvon B, NP      Allergies    Cantaloupe (diagnostic), Watermelon flavor, Ivp dye [iodinated contrast media], Benadryl  [diphenhydramine], Ciprofloxacin, and Reglan [metoclopramide]    Review of Systems   Review of Systems  Respiratory:  Positive for cough and shortness of breath.   All other systems reviewed and are negative.  Physical Exam Updated Vital Signs BP (!) 154/109 (BP Location: Left Arm)    Pulse 78    Temp 98.2 F (36.8 C) (Oral)    Resp 18    Ht 5\' 2"  (1.575 m)    Wt 122.5 kg    LMP 12/26/2021 (Approximate)    SpO2 95%    BMI 49.38 kg/m  Physical Exam Vitals and nursing note reviewed.  Constitutional:      General: She is not in acute distress.    Appearance: Normal appearance. She is obese.  HENT:     Head: Normocephalic and atraumatic.  Eyes:     General:        Right eye: No discharge.        Left eye: No discharge.  Cardiovascular:     Rate and Rhythm: Normal rate and regular rhythm.     Heart sounds: No murmur heard.   No friction rub. No gallop.  Pulmonary:     Effort: Pulmonary effort is normal.     Breath sounds: Normal breath sounds.     Comments: Patient with an expiratory wheezing, no biphasic wheezing, no rhonchi, stridor, focal consolidation.  She has good air movement throughout both lung fields with occasional wet cough Abdominal:  General: Bowel sounds are normal.     Palpations: Abdomen is soft.  Skin:    General: Skin is warm and dry.     Capillary Refill: Capillary refill takes less than 2 seconds.  Neurological:     Mental Status: She is alert and oriented to person, place, and time.  Psychiatric:        Mood and Affect: Mood normal.        Behavior: Behavior normal.    ED Results / Procedures / Treatments   Labs (all labs ordered are listed, but only abnormal results are displayed) Labs Reviewed  RESP PANEL BY RT-PCR (FLU A&B, COVID) [id number]    EKG None  Radiology DG Chest Portable 1 View  Result Date: 01/15/2022 CLINICAL DATA:  Cough EXAM: PORTABLE CHEST 1 VIEW COMPARISON:  10/30/2021 FINDINGS: The heart size and mediastinal contours are  within normal limits. Both lungs are clear. The visualized skeletal structures are unremarkable. IMPRESSION: No active disease. Electronically Signed   By: Fidela Salisbury M.D.   On: 01/15/2022 20:42    Procedures Procedures    Medications Ordered in ED Medications - No data to display  ED Course/ Medical Decision Making/ A&P                           Medical Decision Making Amount and/or Complexity of Data Reviewed Radiology: ordered.   The patient with significant comorbidities of history of asthma, obesity who presents with several days of cough, fatigue, shortness of breath.  She does endorse cough productive of blood x1.  As well as some nausea and vomiting x1.  She is not nauseous, vomiting, headache, or having cough productive of blood at time my evaluation. The emergent differential diagnosis includes pulmonary embolism, pneumonia, acute bronchitis, asthma exacerbation, acute hypoxic respiratory failure, versus other.  This is not an exhaustive differential.  Clinical concern for ACS, dissection, aneurysm at this time.  Patient with no history of recent travel, unilateral leg swelling, decreased oxygen saturation.  She does have 1 episode of reported hemoptysis, so cannot be negative by [facility] criteria, however she is not having continued hemoptysis, has a reasonable alternative diagnosis of acute bronchitis. I do not believe that she needs evaluation with DVT study, ordered CTA PE study at this time.  Discussed this decision making with patient patient agrees to plan.  Obtain lab work including [place] which is negative for COVID, flu.  I obtained radiographic imaging including plain film of the chest which shows no acute intrathoracic abnormality.  I independently interpreted these findings.  I agree with the radiologist interpretation.  With physical exam findings of diffuse and expiratory wheezing, wet productive cough, history of asthma, increased inhaler use, we will treat for acute  bronchitis versus asthma exacerbation.  We will refill patient's asthma inhaler, as well as put her on a burst of steroids.  Encourage follow-up with her PCP for further evaluation.  Patient discharged in stable condition at this time, extensive return precautions given. Final Clinical Impression(s) / ED Diagnoses Final diagnoses:  Acute bronchitis, unspecified organism  Moderate persistent asthma with exacerbation    Rx / DC Orders ED Discharge Orders          Ordered    albuterol (VENTOLIN HFA) 108 (90 Base) MCG/ACT inhaler  Every 4 hours PRN        01/15/22 2258    predniSONE (DELTASONE) 50 MG tablet  Daily  01/15/22 2258              Anselmo Pickler, PA-C 01/16/22 0014    Hayden Rasmussen, MD 01/16/22 [phone]

## 2023-01-05 IMAGING — CR DG KNEE COMPLETE 4+V*L*
4 series · 4 of 4 positions shown · non-contrast
Comparison: None.

CLINICAL DATA: LEFT knee pain after injury.

EXAM:
LEFT KNEE - COMPLETE 4+ VIEW

[t knee ap left *]
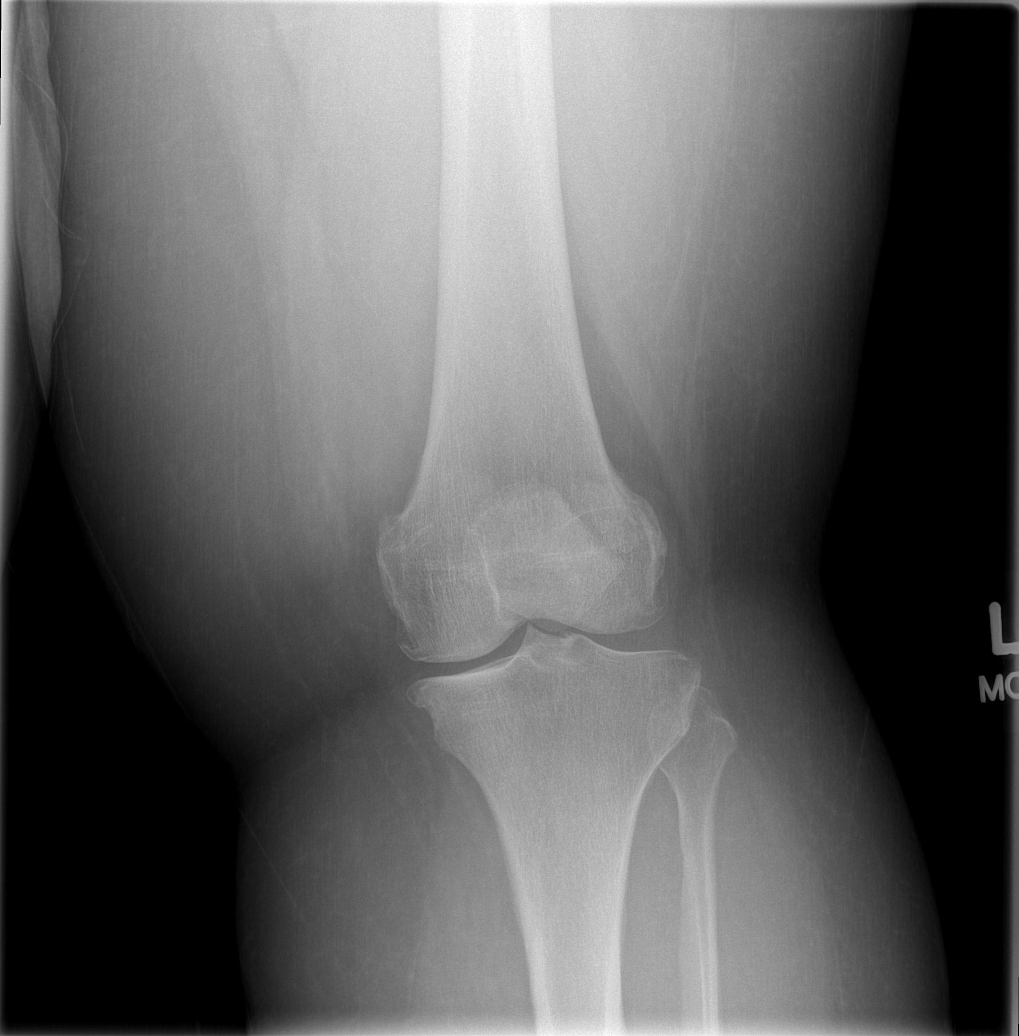

[t knee oblique left * (1 of 2)]
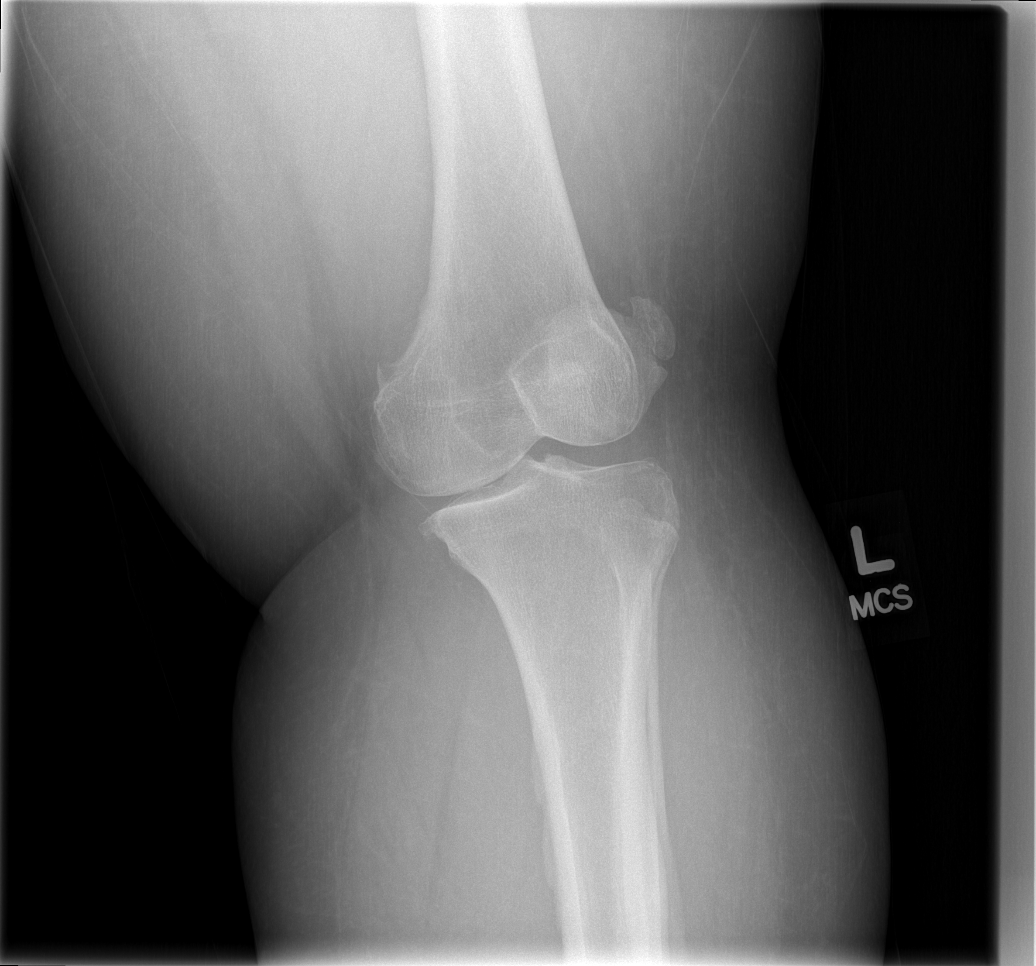

[t knee oblique left * (2 of 2)]
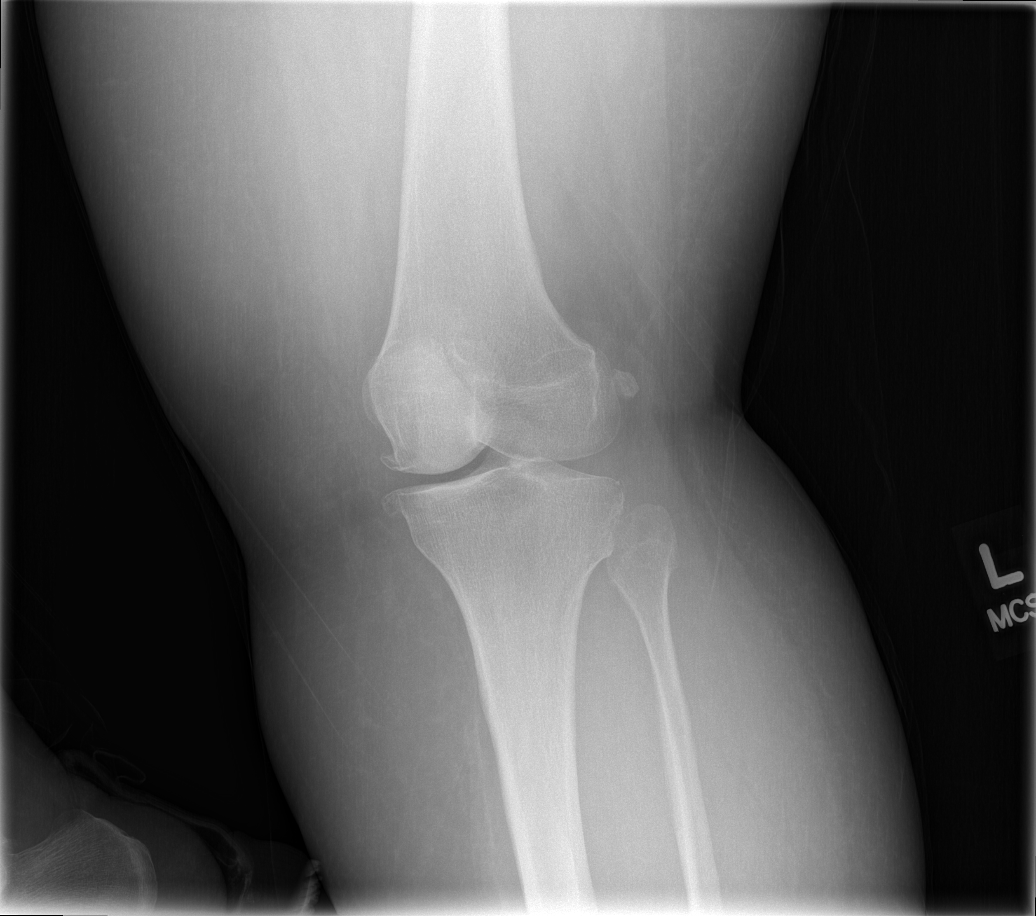

[t knee lat left *]
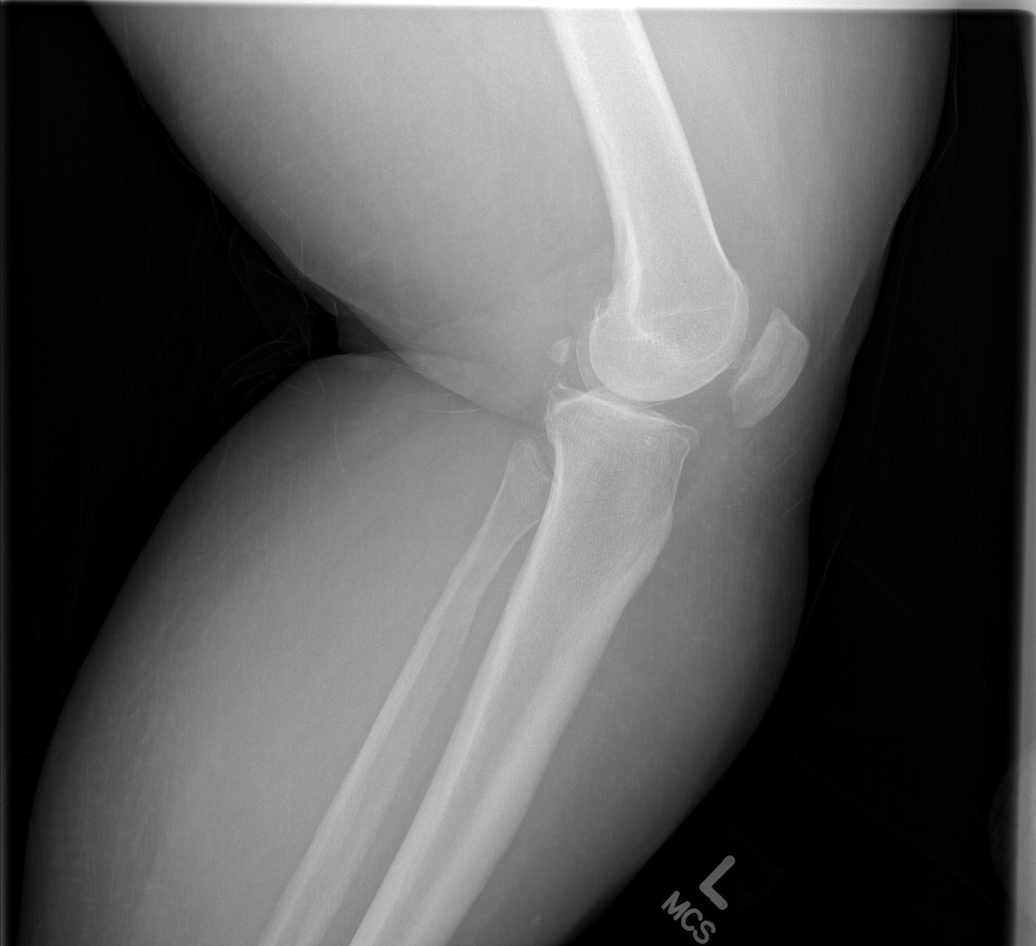

[4 of 4 positions shown; findings below may reference images not displayed]

FINDINGS: Deformity of the patella, compatible with fracture, of uncertain age
but most likely chronic

Distal femur appears intact and normally aligned. Proximal tibia and
fibula appear intact and normally aligned. Mild degenerative
spurring noted at the medial compartment. No appreciable joint
effusion and adjacent soft tissues are unremarkable.
IMPRESSION: 1. Fracture deformity of the patella, of uncertain age but most
likely chronic.
2. No acute-appearing osseous abnormality.
3. Mild degenerative change at the medial compartment. Lateral and
patellofemoral compartments appear relatively well preserved.
